# Patient Record
Sex: Male | Born: 1950 | Race: Black or African American | Hispanic: No | Marital: Single | Smoking: Current some day smoker
Health system: Southern US, Community
[De-identification: ages and names within clinical notes are randomized; demographics above are authoritative.]

## PROBLEM LIST (undated history)

## (undated) DIAGNOSIS — I10 Essential (primary) hypertension: Secondary | ICD-10-CM

## (undated) DIAGNOSIS — Z992 Dependence on renal dialysis: Secondary | ICD-10-CM

## (undated) DIAGNOSIS — K219 Gastro-esophageal reflux disease without esophagitis: Secondary | ICD-10-CM

## (undated) DIAGNOSIS — E875 Hyperkalemia: Secondary | ICD-10-CM

## (undated) DIAGNOSIS — N289 Disorder of kidney and ureter, unspecified: Secondary | ICD-10-CM

## (undated) HISTORY — PX: AV FISTULA PLACEMENT: SHX1204

---

## 2007-07-19 ENCOUNTER — Encounter: Payer: Self-pay | Admitting: Cardiology

## 2007-08-11 ENCOUNTER — Ambulatory Visit: Payer: Self-pay | Admitting: Vascular Surgery

## 2007-08-18 ENCOUNTER — Ambulatory Visit: Payer: Self-pay | Admitting: Vascular Surgery

## 2007-09-06 ENCOUNTER — Ambulatory Visit: Payer: Self-pay | Admitting: Vascular Surgery

## 2007-09-06 ENCOUNTER — Ambulatory Visit (HOSPITAL_COMMUNITY): Admission: RE | Admit: 2007-09-06 | Discharge: 2007-09-06 | Payer: Self-pay | Admitting: Vascular Surgery

## 2007-10-06 ENCOUNTER — Ambulatory Visit: Payer: Self-pay | Admitting: Vascular Surgery

## 2007-12-03 ENCOUNTER — Ambulatory Visit (HOSPITAL_COMMUNITY): Admission: RE | Admit: 2007-12-03 | Discharge: 2007-12-03 | Payer: Self-pay | Admitting: Nephrology

## 2007-12-08 ENCOUNTER — Ambulatory Visit: Payer: Self-pay | Admitting: Vascular Surgery

## 2007-12-24 ENCOUNTER — Ambulatory Visit (HOSPITAL_COMMUNITY): Admission: RE | Admit: 2007-12-24 | Discharge: 2007-12-24 | Payer: Self-pay | Admitting: Nephrology

## 2007-12-27 ENCOUNTER — Ambulatory Visit (HOSPITAL_COMMUNITY): Admission: RE | Admit: 2007-12-27 | Discharge: 2007-12-27 | Payer: Self-pay | Admitting: Nephrology

## 2008-12-19 ENCOUNTER — Encounter: Payer: Self-pay | Admitting: Cardiology

## 2009-02-12 ENCOUNTER — Emergency Department (HOSPITAL_COMMUNITY): Admission: EM | Admit: 2009-02-12 | Discharge: 2009-02-12 | Payer: Self-pay | Admitting: Emergency Medicine

## 2009-02-13 ENCOUNTER — Emergency Department (HOSPITAL_COMMUNITY): Admission: EM | Admit: 2009-02-13 | Discharge: 2009-02-13 | Payer: Self-pay | Admitting: Emergency Medicine

## 2009-03-23 DIAGNOSIS — I10 Essential (primary) hypertension: Secondary | ICD-10-CM | POA: Insufficient documentation

## 2009-03-23 DIAGNOSIS — N186 End stage renal disease: Secondary | ICD-10-CM

## 2009-04-06 ENCOUNTER — Emergency Department (HOSPITAL_COMMUNITY): Admission: EM | Admit: 2009-04-06 | Discharge: 2009-04-06 | Payer: Self-pay | Admitting: Family Medicine

## 2009-04-10 ENCOUNTER — Ambulatory Visit: Payer: Self-pay | Admitting: Cardiology

## 2009-04-10 DIAGNOSIS — R0602 Shortness of breath: Secondary | ICD-10-CM

## 2009-04-20 ENCOUNTER — Encounter (INDEPENDENT_AMBULATORY_CARE_PROVIDER_SITE_OTHER): Payer: Self-pay | Admitting: *Deleted

## 2010-03-12 NOTE — Letter (Signed)
Summary: [facility] Medical Care Problem List  [facility] Medical Care Problem List   Imported By: Roderic Ovens 04/24/2009 10:14:00  _____________________________________________________________________  External Attachment:    Type:   Image     Comment:   External Document

## 2010-03-12 NOTE — Assessment & Plan Note (Signed)
Summary: NP6/SOB/JSS   Visit Type:  Initial Consult Referring Provider:  Dr. Arrie Aran  CC:  Sob-HTN.  History of Present Illness: 60 yo with history of ESRD and HTN presents for evaluation of exertional dyspnea.  Patient reports labored breathing/shortness of breath when walking up a hill or climbing a flight of steps.  He says this does not happen every time and thinks that it happens when his hemoglobin is low.  He has never had chest pain or tightness.  He works as a Visual merchandiser (heating and A/C) and says that he does not get short of breath on the job.  No orthopnea or PND.  BP is 155/89 today but he has not taken any of his meds today.    ECG: NSR, biatrial enlargement, LVH with repolarization abnormality  labs (2/11): HCT 33  Current Medications (verified): 1)  Labetalol 400mg  Tablet .... Take 1 Tablet By Mouth Two Times A Day 2)  Fosinopril Sodium 40 Mg Tabs (Fosinopril Sodium) .... Take 1 Tablet By Mouth Two Times A Day 3)  Nexium 40 Mg Cpdr (Esomeprazole Magnesium) .... Take 1 Capsule By Mouth Once A Day 4)  Aspirin 81 Mg Tbec (Aspirin) .... Take One Tablet By Mouth Daily 5)  Rena-Vite  Tabs (B Complex-C-Folic Acid) .... Take 1 Tablet By Mouth Once A Day 6)  Norvasc 10 Mg Tabs (Amlodipine Besylate) .... Take 1 Tablet By Mouth Two Times A Day 7)  Doxazosin Mesylate 8 Mg Tabs (Doxazosin Mesylate) .... Take 1 Tablet By Mouth Once A Day 8)  Protonix 40 Mg Tbec (Pantoprazole Sodium) .... Take 1 Tablet By Mouth Once A Day  Allergies (verified): No Known Drug Allergies  Past History:  Past Medical History: 1. End-stage renal disease on hemodialysis Tuesday, Thursday and Saturday.  Thought to be secondary to HTN.  2. Hypertension.  3. Fe deficiency anemia 4. Secondary hyperparathyroidism 5. Prior cocaine abuse (198s) 6. Prior smoker (quit 2005) 7. H/o dialysis catheter infection.  Family History: Father with MI at 83  Social History: The patient is single.  He  lives between a family member's house and his girlfriend's house.  He quit smoking in 2005 except for an "occasional" cigarette.  He denies any EtOH or illegal drug use.  He used cocaine in the 1980s.  He works as a Visual merchandiser (heating and A/C).   Review of Systems       All systems reviewed and negative except as per HPI.   Vital Signs:  Patient profile:   60 year old male Height:      69 inches Weight:      178 pounds BMI:     26.38 Pulse rate:   72 / minute Pulse rhythm:   regular Resp:     18 per minute BP sitting:   155 / 89  (right arm) Cuff size:   large  Vitals Entered By: Vikki Ports (April 10, 2009 10:56 AM)  Physical Exam  General:  Well developed, well nourished, in no acute distress. Head:  normocephalic and atraumatic Nose:  no deformity, discharge, inflammation, or lesions Mouth:  Teeth, gums and palate normal. Oral mucosa normal. Neck:  Neck supple, JVP 8 cm. No masses, thyromegaly or abnormal cervical nodes. Lungs:  Clear bilaterally to auscultation and percussion. Heart:  Non-displaced PMI, chest non-tender; regular rate and rhythm, S1, S2 without murmurs, rubs. +S4. Carotid upstroke normal, no bruit.  Pedals normal pulses. Trace ankle edema.  Abdomen:  Bowel sounds positive; abdomen soft  and non-tender without masses, organomegaly, or hernias noted. No hepatosplenomegaly. Msk:  Back normal, normal gait. Muscle strength and tone normal. Extremities:  No clubbing or cyanosis. Neurologic:  Alert and oriented x 3. Skin:  Intact without lesions or rashes. Psych:  Normal affect.   Impression & Recommendations:  Problem # 1:  SHORTNESS OF BREATH (ICD-786.05) Patient has shortness of breath with moderate exertion such as stairs or up a hill.  He attributes it to anemia.  However, his long-standing HTN and ESRD do put him at higher risk for CAD.  I think that it is reasonable to risk stratify with ETT-myoview given the possibility of ischemic diastolic  dysfunction as a cause of his symptoms.  I will also check an echocardiogram to assess LV function.  He should continue ASA 81 mg daily.    Problem # 2:  HYPERTENSION (ICD-401.9) BP is high today but he has not taken any meds.  Will continue current meds with adjustment by Dr. Arrie Aran as needed.   I will have him get fasting lipids/LFTs  Other Orders: Nuclear Stress Test (Nuc Stress Test) Echocardiogram (Echo)  Patient Instructions: 1)  Your physician has requested that you have an echocardiogram.  Echocardiography is a painless test that uses sound waves to create images of your heart. It provides your doctor with information about the size and shape of your heart and how well your heart's chambers and valves are working.  This procedure takes approximately one hour. There are no restrictions for this procedure. 2)  Your physician has requested that you have an exercise stress myoview.  For further information please visit [email or web address]  Please follow instruction sheet, as given. 3)  Your physician recommends that you have a FASTING lipid profile/liver profile --- 786.09 401.9 4)  Your physician recommends that you schedule a follow-up appointment in: after testing is complete in 2 weeks with Dr Shirlee Latch

## 2010-03-12 NOTE — Letter (Signed)
Summary: Generic Letter  [facility]     [place], [place]    Phone:   Fax:         April 20, 2009 MRN: [id number]    ARVAL BRANDSTETTER [address] [place], [place]  [address]    Dear Mr. Navratil,   During your last office visit.  Dr. Shirlee Latch ordered Labs, Echocardiogram and Stress Myoview.  We have been unable to reach you.  Please give our office a call at your earliest convenience to schedule the above appointments.          Sincerely,  Jasmine December Ferguson/Dr. Marca Ancona   This letter has been electronically signed by your physician.

## 2010-04-28 LAB — COMPREHENSIVE METABOLIC PANEL WITH GFR
ALT: 19 U/L (ref 0–53)
AST: 22 U/L (ref 0–37)
Alkaline Phosphatase: 65 U/L (ref 39–117)
BUN: 40 mg/dL — ABNORMAL HIGH (ref 6–23)
Chloride: 100 meq/L (ref 96–112)
Potassium: 4.2 meq/L (ref 3.5–5.1)
Total Protein: 7.6 g/dL (ref 6.0–8.3)

## 2010-04-28 LAB — CBC
HCT: 35.4 % — ABNORMAL LOW (ref 39.0–52.0)
HCT: 35.7 % — ABNORMAL LOW (ref 39.0–52.0)
Hemoglobin: 12.1 g/dL — ABNORMAL LOW (ref 13.0–17.0)
Hemoglobin: 12.3 g/dL — ABNORMAL LOW (ref 13.0–17.0)
MCHC: 34.3 g/dL (ref 30.0–36.0)
MCHC: 34.5 g/dL (ref 30.0–36.0)
MCV: 80.6 fL (ref 78.0–100.0)
MCV: 81.9 fL (ref 78.0–100.0)
Platelets: 200 10*3/uL (ref 150–400)
Platelets: 213 10*3/uL (ref 150–400)
RBC: 4.43 MIL/uL (ref 4.22–5.81)
RDW: 16.7 % — ABNORMAL HIGH (ref 11.5–15.5)
RDW: 17 % — ABNORMAL HIGH (ref 11.5–15.5)
WBC: 9.4 K/uL (ref 4.0–10.5)

## 2010-04-28 LAB — URINE CULTURE: Culture: NO GROWTH

## 2010-04-28 LAB — COMPREHENSIVE METABOLIC PANEL
Albumin: 3.4 g/dL — ABNORMAL LOW (ref 3.5–5.2)
Albumin: 3.6 g/dL (ref 3.5–5.2)
BUN: 55 mg/dL — ABNORMAL HIGH (ref 6–23)
CO2: 26 mEq/L (ref 19–32)
Calcium: 8.8 mg/dL (ref 8.4–10.5)
Calcium: 9.2 mg/dL (ref 8.4–10.5)
Creatinine, Ser: 12.28 mg/dL — ABNORMAL HIGH (ref 0.4–1.5)
Creatinine, Ser: 9.13 mg/dL — ABNORMAL HIGH (ref 0.4–1.5)
GFR calc Af Amer: 7 mL/min — ABNORMAL LOW (ref >60)
GFR calc non Af Amer: 6 mL/min — ABNORMAL LOW (ref >60)
Glucose, Bld: 111 mg/dL — ABNORMAL HIGH (ref 70–99)
Glucose, Bld: 115 mg/dL — ABNORMAL HIGH (ref 70–99)
Sodium: 139 mEq/L (ref 135–145)
Total Bilirubin: 0.8 mg/dL (ref 0.3–1.2)
Total Protein: 7.5 g/dL (ref 6.0–8.3)

## 2010-04-28 LAB — URINALYSIS, ROUTINE W REFLEX MICROSCOPIC
Bilirubin Urine: NEGATIVE
Glucose, UA: 100 mg/dL — AB
Ketones, ur: NEGATIVE mg/dL
Leukocytes, UA: NEGATIVE
Nitrite: NEGATIVE
Protein, ur: >300 mg/dL — AB
Specific Gravity, Urine: 1.018 (ref 1.005–1.030)
Urobilinogen, UA: 0.2 mg/dL (ref 0.0–1.0)
pH: 8.5 — ABNORMAL HIGH (ref 5.0–8.0)

## 2010-04-28 LAB — CULTURE, BLOOD (ROUTINE X 2)
Culture: NO GROWTH
Culture: NO GROWTH

## 2010-04-28 LAB — CLOSTRIDIUM DIFFICILE EIA: C difficile Toxins A+B, EIA: NEGATIVE

## 2010-04-28 LAB — DIFFERENTIAL
Basophils Absolute: 0 10*3/uL (ref 0.0–0.1)
Basophils Relative: 0 % (ref 0–1)
Basophils Relative: 0 % (ref 0–1)
Eosinophils Absolute: 0.1 10*3/uL (ref 0.0–0.7)
Eosinophils Relative: 1 % (ref 0–5)
Lymphocytes Relative: 14 % (ref 12–46)
Lymphocytes Relative: 6 % — ABNORMAL LOW (ref 12–46)
Lymphs Abs: 0.5 K/uL — ABNORMAL LOW (ref 0.7–4.0)
Monocytes Absolute: 0.7 10*3/uL (ref 0.1–1.0)
Monocytes Absolute: 0.9 10*3/uL (ref 0.1–1.0)
Monocytes Relative: 14 % — ABNORMAL HIGH (ref 3–12)
Monocytes Relative: 8 % (ref 3–12)
Neutro Abs: 4.1 10*3/uL (ref 1.7–7.7)
Neutro Abs: 8 10*3/uL — ABNORMAL HIGH (ref 1.7–7.7)
Neutrophils Relative %: 69 % (ref 43–77)
Neutrophils Relative %: 86 % — ABNORMAL HIGH (ref 43–77)

## 2010-04-28 LAB — LACTIC ACID, PLASMA: Lactic Acid, Venous: 0.9 mmol/L (ref 0.5–2.2)

## 2010-04-28 LAB — URINE MICROSCOPIC-ADD ON

## 2010-04-28 LAB — STOOL CULTURE

## 2010-04-28 LAB — LIPASE, BLOOD: Lipase: 40 U/L (ref 11–59)

## 2010-04-28 LAB — CK TOTAL AND CKMB (NOT AT ARMC): Relative Index: 1.2 (ref 0.0–2.5)

## 2010-05-01 LAB — CBC
Hemoglobin: 12.4 g/dL — ABNORMAL LOW (ref 13.0–17.0)
MCHC: 33.6 g/dL (ref 30.0–36.0)
MCV: 84.2 fL (ref 78.0–100.0)
RBC: 4.38 MIL/uL (ref 4.22–5.81)
WBC: 5.8 10*3/uL (ref 4.0–10.5)

## 2010-05-01 LAB — DIFFERENTIAL
Basophils Relative: 1 % (ref 0–1)
Eosinophils Absolute: 0 10*3/uL (ref 0.0–0.7)
Lymphs Abs: 0.6 10*3/uL — ABNORMAL LOW (ref 0.7–4.0)
Monocytes Absolute: 1 10*3/uL (ref 0.1–1.0)
Monocytes Relative: 18 % — ABNORMAL HIGH (ref 3–12)
Neutro Abs: 4.2 10*3/uL (ref 1.7–7.7)
Neutrophils Relative %: 72 % (ref 43–77)

## 2010-05-01 LAB — POCT I-STAT, CHEM 8
Calcium, Ion: 1.04 mmol/L — ABNORMAL LOW (ref 1.12–1.32)
Chloride: 102 mEq/L (ref 96–112)
Creatinine, Ser: 11 mg/dL — ABNORMAL HIGH (ref 0.4–1.5)
Glucose, Bld: 101 mg/dL — ABNORMAL HIGH (ref 70–99)
Potassium: 4.1 mEq/L (ref 3.5–5.1)

## 2010-06-25 NOTE — Assessment & Plan Note (Signed)
OFFICE VISIT   Corey Lamb, Corey Lamb  DOB:  1950/09/05                                       08/18/2007  [id number]#:[id number]   The patient is a 60 year old male referred for consideration for long-  term hemodialysis access.  He has been on dialysis since April of 2009.  He is currently using a left-sided Diatek catheter.  He has had no  problems with the catheter.   He currently dialyzes on Tuesday, Thursday and Saturday.  He is right-  handed.   PAST MEDICAL HISTORY:  Significant for end-stage renal disease and  hypertension.  He has had no prior operations.   MEDICATIONS:  Include doxazosin, Benicar, Norvasc and lisinopril.   He has no known drug allergies.   SOCIAL HISTORY:  He works as an Geographical information systems officer.   REVIEW OF SYSTEMS:  He denies episodes of chest pain, shortness of  breath, TIA, stroke, diabetes or arthritis.   PHYSICAL EXAM:  Blood pressure is 123/81 in the left arm, pulse is 73  and regular.  HEENT is unremarkable.  He has 2+ carotid pulses without  bruit.  Chest:  Clear to auscultation.  Cardiac:  Exam is regular rate  and rhythm without murmur.  Abdomen is soft, nontender, nondistended.  No masses.  He has 2+ brachial and radial pulses bilaterally.  On  placement of a tourniquet in the left upper arm, there is an easily  palpable cephalic vein extending from the elbow up to the upper arm.   He had a vein mapping ultrasound performed on July 1.  This showed that  the cephalic vein from the elbow up is approximately 0.4 cm in diameter.  The vein becomes quite small in the distal forearm.   I believe the best option for this patient for long-term hemodialysis  access is going to be a left brachiocephalic AV fistula.  I described to  him today the procedure details, risks, benefits, and possible  complications including but not limited to bleeding, infection fistula  not maturation.  He will have the fistula placed on September 06, 2007 as  he  is currently working on installing an air conditioning system.   Janetta Hora. Fields, MD  Electronically Signed   CEF/MEDQ  D:  08/18/2007  T:  08/19/2007  Job:  [id number]   cc:   [facility]

## 2010-06-25 NOTE — Assessment & Plan Note (Signed)
OFFICE VISIT   ALAM, GUTERREZ  DOB:  [birth year]                                       10/06/2007  [id number]#:[id number]   The patient returns for followup today after placement of a left  brachiocephalic AV fistula approximately 1 month ago.  Currently  dialyzing via catheter.   EXAM:  Today he has no evidence of steal.  Incision is well-healed.  He  has palpable thrill and easily visible fistula all the way up to the mid  upper arm.   Fistula should be ready for use in the next 3 to 4 weeks.  He will  follow up with me on an as-needed basis.   Janetta Hora. Fields, MD  Electronically Signed   CEF/MEDQ  D:  10/06/2007  T:  10/07/2007  Job:  [id number]   cc:   [facility]

## 2010-06-25 NOTE — Procedures (Signed)
CEPHALIC VEIN MAPPING   INDICATION:  Vein mapping for dialysis access.  Right arm dominant.   HISTORY:  ESRD.   EXAM:  The right cephalic vein is not evaluated.   Diameter measurements range from   The left cephalic vein is compressible.   Diameter measurements range from 0.30 cm - 0.41 cm.   See attached worksheet for all measurements.   IMPRESSION:  Patent left cephalic vein which is of acceptable diameter  for use as a dialysis access from the level of the wrist to the proximal  arm.  It appears there is a paralateral branch that runs from the level  of the wrist to the proximal forearm.   ___________________________________________  Janetta Hora Fields, MD   PB/MEDQ  D:  08/12/2007  T:  08/12/2007  Job:  [id number]

## 2010-06-25 NOTE — Op Note (Signed)
NAME:  Corey Lamb, Corey Lamb                ACCOUNT NO.:  [id number]   MEDICAL RECORD NO.:  [id number]          PATIENT TYPE:  AMB   LOCATION:  SDS                          FACILITY:  [facility]   PHYSICIAN:  Janetta Hora. Fields, MD  DATE OF BIRTH:  07-03-1950   DATE OF PROCEDURE:  09/06/2007  DATE OF DISCHARGE:  09/06/2007                               OPERATIVE REPORT   PROCEDURE:  Left brachiocephalic arteriovenous fistula.   PREOPERATIVE DIAGNOSIS:  End-stage renal disease.   POSTOPERATIVE DIAGNOSIS:  End-stage renal disease.   ANESTHESIA:  Local with IV sedation.   ASSISTANT:  Wilmon Arms, PA-C   OPERATIVE FINDING:  A 3-4 mm left cephalic vein.   OPERATIVE DETAILS:  After obtaining informed consent, the patient was  taken to the operating room.  The patient was placed in supine position  on the operating room table.  After adequate sedation, the patient's  entire left upper extremity was prepped and draped in the usual sterile  fashion.  Local anesthesia was infiltrated into the antecubital crease.  A transverse incision was made in this location and carried down through  the subcutaneous tissues down to the level of cephalic vein.  The  cephalic vein was dissected free circumferentially.  It was slightly  sclerotic in external appearance.  It was approximately 3.5 to 4 mm in  diameter.  The small side branches were ligated and divided between silk  ties.  Next, the brachial artery was dissected free in the medial  portion of the incision.  The patient was then given 5000 units of  intravenous heparin.  The distal cephalic vein was ligated with a 2-0  silk tie and transected.  It was then swung over to the level of the  brachial artery.  It was gently distended with heparinized saline.  There were some small webs at the proximal aspect of the vein and these  were debrided away.  The vein accepted up to a 5-mm dilator.  Vessel  loops were used to control the brachial artery proximally  and distally.  A longitudinal opening was made in the brachial artery and the vein was  sewn end-to-vein to the side of  artery using a running 6-0 Prolene  suture.  Just prior to completion of anastomosis, this forebled, back-  bled and thoroughly flushed.  Anastomosis was secured, clamps were  released, there was a palpable thrill in the fistula immediately.  Next,  hemostasis was obtained.  The subcutaneous tissues were reapproximated  using running 3-0 Vicryl suture.  The skin was closed with 4-0 Vicryl  subcuticular stitch.  The patient tolerated the procedure well and there  were no complications through.  Instruments, sponge, and needle counts  were correct at the end of the case.  The patient was taken to the  recovery room in stable condition.      Janetta Hora. Fields, MD  Electronically Signed     CEF/MEDQ  D:  09/07/2007  T:  09/07/2007  Job:  [id number]

## 2010-06-25 NOTE — Assessment & Plan Note (Signed)
OFFICE VISIT   AXCEL, HORSCH  DOB:  [birth year]                                       12/08/2007  EAVWU#:[id number]   The patient is status post placement of a left brachiocephalic AV  fistula on August 07, 2007.  He presents to the office today for  evaluation of his fistula, complaining of stinging in the upper portion  of the fistula when the fistula is cannulated.  He denies any numbness  or tingling in his hand.  He states that he has no pain in the fistula  when it is not been cannulated.  He had a fistulogram performed on  December 03, 1998, this showed no significant stenosis throughout the  course of the fistula.   On physical exam today, blood pressure 167/94, pulse is 77 and regular,  he has an easily palpable thrill in the left upper arm AV fistula.  The  fistula is fairly uniform in diameter, approximately 6-7 mm.   I discussed at length with the patient the use of his fistula.  He  states that they are not having difficulty cannulating the fistula and  that he is not having problems with clearance or increased pressures on  dialysis.  Unfortunately, I do not have any explanation for why he has a  burning and stinging sensation in the fistula when he is on dialysis.  I  did discuss with him today that they should be able to use the entire  course of the fistula wherever it is palpable.  Hopefully, his symptoms  will resolve over time.   Janetta Hora. Fields, MD  Electronically Signed   CEF/MEDQ  D:  12/09/2007  T:  12/09/2007  Job:  [id number]   cc:   Terrial Rhodes, M.D.

## 2010-11-08 LAB — POCT I-STAT 4, (NA,K, GLUC, HGB,HCT)
Glucose, Bld: 84
Hemoglobin: 16.3
Potassium: 4.2

## 2010-12-16 ENCOUNTER — Emergency Department (INDEPENDENT_AMBULATORY_CARE_PROVIDER_SITE_OTHER)
Admission: EM | Admit: 2010-12-16 | Discharge: 2010-12-16 | Disposition: A | Discharge status: Home / Self Care | Payer: Medicare Other | Source: Home / Self Care | Attending: Family Medicine | Admitting: Family Medicine

## 2010-12-16 DIAGNOSIS — J069 Acute upper respiratory infection, unspecified: Secondary | ICD-10-CM

## 2010-12-16 DIAGNOSIS — J209 Acute bronchitis, unspecified: Secondary | ICD-10-CM

## 2010-12-16 HISTORY — DX: Gastro-esophageal reflux disease without esophagitis: K21.9

## 2010-12-16 HISTORY — DX: Essential (primary) hypertension: I10

## 2010-12-16 HISTORY — DX: Disorder of kidney and ureter, unspecified: N28.9

## 2010-12-16 MED ORDER — FLUTICASONE PROPIONATE 50 MCG/ACT NA SUSP: 2.0000 | Freq: Every day | NASAL | Status: DC

## 2010-12-16 MED ORDER — AZITHROMYCIN 250 MG PO TABS: 250.0000 mg | ORAL_TABLET | Freq: Every day | ORAL | Status: AC

## 2010-12-16 MED ORDER — GUAIFENESIN-CODEINE 100-10 MG/5ML PO SYRP: 5.0000 mL | ORAL_SOLUTION | Freq: Three times a day (TID) | ORAL | Status: AC | PRN

## 2010-12-16 NOTE — ED Provider Notes (Signed)
History     CSN: [id number] Arrival date & time: 12/16/2010  2:07 PM   First MD Initiated Contact with Patient 12/16/10 1410      No chief complaint on file.   (Consider location/radiation/quality/duration/timing/severity/associated sxs/prior treatment) Patient is a 60 y.o. male presenting with URI.  URI The primary symptoms include sore throat and cough. Primary symptoms do not include fever, ear pain, nausea or vomiting. The current episode started 3 to 5 days ago. This is a new problem. The problem has been gradually worsening.  The sore throat began more than 2 days ago. The sore throat has been unchanged since its onset. The sore throat is moderate in intensity. The sore throat is accompanied by trouble swallowing and hoarse voice. The sore throat is not accompanied by drooling or stridor.  The cough began 3 to 5 days ago. The cough is new. The cough is non-productive and dry.  Symptoms associated with the illness include congestion and rhinorrhea. The illness is not associated with chills. Risk factors for severe complications from URI include chronic kidney disease.    No past medical history on file.  No past surgical history on file.  No family history on file.  History  Substance Use Topics  . Smoking status: Not on file  . Smokeless tobacco: Not on file  . Alcohol Use: Not on file      Review of Systems  Constitutional: Negative for fever and chills.  HENT: Positive for congestion, sore throat, hoarse voice, rhinorrhea and trouble swallowing. Negative for ear pain, sneezing and drooling.   Eyes: Negative.   Respiratory: Positive for cough. Negative for stridor.   Cardiovascular: Negative.   Gastrointestinal: Negative.  Negative for nausea and vomiting.  Genitourinary: Negative.     Allergies  Review of patient's allergies indicates not on file.  Home Medications  No current outpatient prescriptions on file.  BP 155/80  Pulse 72  Temp(Src) 97.7 F (36.5  C) (Oral)  Resp 20  SpO2 100%  Physical Exam  Constitutional: He appears well-developed and well-nourished.  HENT:  Head: Normocephalic and atraumatic.  Right Ear: Tympanic membrane and external ear normal.  Left Ear: Tympanic membrane and external ear normal.  Nose: Mucosal edema present.  Mouth/Throat: Uvula is midline and mucous membranes are normal. Posterior oropharyngeal erythema present. No oropharyngeal exudate or tonsillar abscesses.  Eyes: EOM are normal. Pupils are equal, round, and reactive to light.  Cardiovascular: Normal rate and regular rhythm.   Pulmonary/Chest: Effort normal and breath sounds normal.  Skin: Skin is warm and dry.    ED Course  Procedures (including critical care time)   Labs Reviewed  POCT RAPID STREP A   No results found.   No diagnosis found.    MDM  Rapid strep negative        Richardo Priest, MD 12/16/10 1458

## 2010-12-16 NOTE — ED Notes (Signed)
Pt returned to [facility], requesting to be seen

## 2010-12-16 NOTE — ED Notes (Signed)
Pt told registration earlier that he was leaving and going elsewhere.  Pt returns now, stating he wants to be seen now.

## 2010-12-18 LAB — POCT RAPID STREP A: Streptococcus, Group A Screen (Direct): NEGATIVE

## 2012-01-04 ENCOUNTER — Encounter (HOSPITAL_COMMUNITY): Payer: Self-pay | Admitting: *Deleted

## 2012-01-04 ENCOUNTER — Emergency Department (HOSPITAL_COMMUNITY): Payer: Medicare Other

## 2012-01-04 ENCOUNTER — Emergency Department (HOSPITAL_COMMUNITY)
Admission: EM | Admit: 2012-01-04 | Discharge: 2012-01-04 | Disposition: A | Discharge status: Home / Self Care | Payer: Medicare Other | Attending: Emergency Medicine | Admitting: Emergency Medicine

## 2012-01-04 DIAGNOSIS — K5289 Other specified noninfective gastroenteritis and colitis: Secondary | ICD-10-CM | POA: Insufficient documentation

## 2012-01-04 DIAGNOSIS — R197 Diarrhea, unspecified: Secondary | ICD-10-CM | POA: Insufficient documentation

## 2012-01-04 DIAGNOSIS — Z79899 Other long term (current) drug therapy: Secondary | ICD-10-CM | POA: Insufficient documentation

## 2012-01-04 DIAGNOSIS — K219 Gastro-esophageal reflux disease without esophagitis: Secondary | ICD-10-CM | POA: Insufficient documentation

## 2012-01-04 DIAGNOSIS — R0602 Shortness of breath: Secondary | ICD-10-CM | POA: Insufficient documentation

## 2012-01-04 DIAGNOSIS — F17200 Nicotine dependence, unspecified, uncomplicated: Secondary | ICD-10-CM | POA: Insufficient documentation

## 2012-01-04 DIAGNOSIS — K529 Noninfective gastroenteritis and colitis, unspecified: Secondary | ICD-10-CM

## 2012-01-04 DIAGNOSIS — Z87798 Personal history of other (corrected) congenital malformations: Secondary | ICD-10-CM | POA: Insufficient documentation

## 2012-01-04 DIAGNOSIS — I10 Essential (primary) hypertension: Secondary | ICD-10-CM | POA: Insufficient documentation

## 2012-01-04 DIAGNOSIS — R112 Nausea with vomiting, unspecified: Secondary | ICD-10-CM | POA: Insufficient documentation

## 2012-01-04 HISTORY — DX: Dependence on renal dialysis: Z99.2

## 2012-01-04 LAB — CBC WITH DIFFERENTIAL/PLATELET
Basophils Absolute: 0 10*3/uL (ref 0.0–0.1)
Basophils Relative: 0 % (ref 0–1)
Lymphocytes Relative: 23 % (ref 12–46)
MCHC: 34.1 g/dL (ref 30.0–36.0)
Monocytes Absolute: 0.7 10*3/uL (ref 0.1–1.0)
Neutro Abs: 4.1 10*3/uL (ref 1.7–7.7)
Neutrophils Relative %: 63 % (ref 43–77)
Platelets: 177 10*3/uL (ref 150–400)
RDW: 14.2 % (ref 11.5–15.5)
WBC: 6.5 10*3/uL (ref 4.0–10.5)

## 2012-01-04 LAB — COMPREHENSIVE METABOLIC PANEL
ALT: 17 U/L (ref 0–53)
AST: 11 U/L (ref 0–37)
Albumin: 3.2 g/dL — ABNORMAL LOW (ref 3.5–5.2)
Chloride: 93 mEq/L — ABNORMAL LOW (ref 96–112)
Creatinine, Ser: 14.69 mg/dL — ABNORMAL HIGH (ref 0.50–1.35)
Potassium: 4.2 mEq/L (ref 3.5–5.1)
Sodium: 137 mEq/L (ref 135–145)
Total Bilirubin: 0.3 mg/dL (ref 0.3–1.2)

## 2012-01-04 LAB — POCT I-STAT TROPONIN I: Troponin i, poc: 0.02 ng/mL (ref 0.00–0.08)

## 2012-01-04 LAB — TROPONIN I: Troponin I: <0.3 ng/mL (ref <0.30)

## 2012-01-04 MED ORDER — IOHEXOL 300 MG/ML  SOLN
100.0000 mL | Freq: Once | INTRAMUSCULAR | Status: AC | PRN
  Administered 2012-01-04: 100 mL via INTRAVENOUS

## 2012-01-04 MED ORDER — ONDANSETRON HCL 4 MG PO TABS: 4.0000 mg | ORAL_TABLET | Freq: Four times a day (QID) | ORAL | Status: DC

## 2012-01-04 MED ORDER — IOHEXOL 300 MG/ML  SOLN
20.0000 mL | INTRAMUSCULAR | Status: AC
Start: 2012-01-04 — End: 2012-01-04
  Administered 2012-01-04: 20 mL via ORAL

## 2012-01-04 MED ORDER — SODIUM CHLORIDE 0.9 % IV SOLN: Freq: Once | INTRAVENOUS | Status: DC

## 2012-01-04 NOTE — ED Notes (Addendum)
Pt reports having generalized abd pain and distention since wed. Having diarrhea and one episode of vomiting. Last dialysis tx Thursday.

## 2012-01-04 NOTE — ED Notes (Signed)
Pt presents to department for evaluation of diffuse abdominal pain. Also states nausea and vomiting. Ongoing x4 days. 8/10 pain at the time. He is hemodialysis patient, treatments Tuesday, Thursday, and Saturday. States he missed his treatment Saturday. Pt is conscious alert and oriented x4.

## 2012-01-04 NOTE — ED Provider Notes (Signed)
History     CSN: [id number]  Arrival date & time 01/04/12  1042   First MD Initiated Contact with Patient 01/04/12 1138      Chief Complaint  Patient presents with  . Abdominal Pain    (Consider location/radiation/quality/duration/timing/severity/associated sxs/prior treatment) Patient is a 61 y.o. male presenting with abdominal pain. The history is provided by the patient.  Abdominal Pain The primary symptoms of the illness include abdominal pain, shortness of breath, nausea, vomiting and diarrhea. The primary symptoms of the illness do not include fever.  Additional symptoms associated with the illness include diaphoresis. Associated symptoms comments: He reports he started having generalized abdominal pain 5 days ago, worsening until yesterday when he had severe pain, diaphoresis, one episode of vomiting and generalized weakness that kept him from going to dialysis yesterday. No fever. He reports diarrhea until yesterday when he took "a diarrhea pill" he had at home and reports one formed stool today. He continues to pass gas. No previous abdominal surgeries. No further nausea. He denies chest pain and reports mild SOB..    Past Medical History  Diagnosis Date  . Renal disorder   . Hypertension   . GERD (gastroesophageal reflux disease)     History reviewed. No pertinent past surgical history.  History reviewed. No pertinent family history.  History  Substance Use Topics  . Smoking status: Current Some Day Smoker  . Smokeless tobacco: Not on file  . Alcohol Use: No      Review of Systems  Constitutional: Positive for diaphoresis. Negative for fever.  Respiratory: Positive for shortness of breath.   Cardiovascular: Negative for chest pain.  Gastrointestinal: Positive for nausea, vomiting, abdominal pain and diarrhea. Negative for blood in stool.  Genitourinary: Negative for flank pain.  Musculoskeletal: Negative for myalgias.  Neurological: Positive for weakness.    Psychiatric/Behavioral: Negative for confusion.    Allergies  Review of patient's allergies indicates no known allergies.  Home Medications   Current Outpatient Rx  Name  Route  Sig  Dispense  Refill  . AMLODIPINE BESYLATE 10 MG PO TABS   Oral   Take 10 mg by mouth daily.           Marland Kitchen CALCIUM ACETATE (PHOS BINDER) 667 MG/5ML PO SOLN   Oral   Take by mouth 3 (three) times daily with meals.           Marland Kitchen DOXAZOSIN MESYLATE 8 MG PO TABS   Oral   Take 8 mg by mouth at bedtime.         Marland Kitchen LABETALOL HCL 100 MG PO TABS   Oral   Take 200 mg by mouth 2 (two) times daily.          Marland Kitchen RENA-VITE PO TABS   Oral   Take 1 tablet by mouth daily.           Marland Kitchen OMEPRAZOLE 40 MG PO CPDR   Oral   Take 40 mg by mouth daily.           BP 105/72  Pulse 73  Temp 98 F (36.7 C) (Oral)  Resp 18  SpO2 99%  Physical Exam  Constitutional: He is oriented to person, place, and time. He appears well-developed and well-nourished.  HENT:  Head: Normocephalic.  Neck: Normal range of motion. Neck supple.  Cardiovascular: Normal rate and regular rhythm.   Pulmonary/Chest: Effort normal and breath sounds normal.  Abdominal: Soft. He exhibits distension. He exhibits no mass. There is tenderness. There  is no rebound and no guarding.       Diffuse abdominal tenderness that is greatest at the periumbilical area.   Musculoskeletal: Normal range of motion.  Neurological: He is alert and oriented to person, place, and time.  Skin: Skin is warm and dry. No rash noted.  Psychiatric: He has a normal mood and affect.    ED Course  Procedures (including critical care time)  Labs Reviewed  CBC WITH DIFFERENTIAL - Abnormal; Notable for the following:    RBC 4.03 (*)     Hemoglobin 10.8 (*)     HCT 31.7 (*)     All other components within normal limits  COMPREHENSIVE METABOLIC PANEL - Abnormal; Notable for the following:    Chloride 93 (*)     Glucose, Bld 108 (*)     BUN 69 (*)      Creatinine, Ser 14.69 (*)     Albumin 3.2 (*)     GFR calc non Af Amer 3 (*)     GFR calc Af Amer 4 (*)     All other components within normal limits  LIPASE, BLOOD - Abnormal; Notable for the following:    Lipase 96 (*)     All other components within normal limits  POCT I-STAT TROPONIN I  URINALYSIS, MICROSCOPIC ONLY  TROPONIN I   Results for orders placed during the hospital encounter of 01/04/12  CBC WITH DIFFERENTIAL      Component Value Range   WBC 6.5  4.0 - 10.5 K/uL   RBC 4.03 (*) 4.22 - 5.81 MIL/uL   Hemoglobin 10.8 (*) 13.0 - 17.0 g/dL   HCT 78.2 (*) 95.6 - 21.3 %   MCV 78.7  78.0 - 100.0 fL   MCH 26.8  26.0 - 34.0 pg   MCHC 34.1  30.0 - 36.0 g/dL   RDW 08.6  57.8 - 46.9 %   Platelets 177  150 - 400 K/uL   Neutrophils Relative 63  43 - 77 %   Neutro Abs 4.1  1.7 - 7.7 K/uL   Lymphocytes Relative 23  12 - 46 %   Lymphs Abs 1.5  0.7 - 4.0 K/uL   Monocytes Relative 10  3 - 12 %   Monocytes Absolute 0.7  0.1 - 1.0 K/uL   Eosinophils Relative 3  0 - 5 %   Eosinophils Absolute 0.2  0.0 - 0.7 K/uL   Basophils Relative 0  0 - 1 %   Basophils Absolute 0.0  0.0 - 0.1 K/uL  COMPREHENSIVE METABOLIC PANEL      Component Value Range   Sodium 137  135 - 145 mEq/L   Potassium 4.2  3.5 - 5.1 mEq/L   Chloride 93 (*) 96 - 112 mEq/L   CO2 29  19 - 32 mEq/L   Glucose, Bld 108 (*) 70 - 99 mg/dL   BUN 69 (*) 6 - 23 mg/dL   Creatinine, Ser 62.95 (*) 0.50 - 1.35 mg/dL   Calcium 9.7  8.4 - 28.4 mg/dL   Total Protein 7.6  6.0 - 8.3 g/dL   Albumin 3.2 (*) 3.5 - 5.2 g/dL   AST 11  0 - 37 U/L   ALT 17  0 - 53 U/L   Alkaline Phosphatase 67  39 - 117 U/L   Total Bilirubin 0.3  0.3 - 1.2 mg/dL   GFR calc non Af Amer 3 (*) >90 mL/min   GFR calc Af Amer 4 (*) >90 mL/min  LIPASE,  BLOOD      Component Value Range   Lipase 96 (*) 11 - 59 U/L  POCT I-STAT TROPONIN I      Component Value Range   Troponin i, poc 0.02  0.00 - 0.08 ng/mL   Comment 3           TROPONIN I      Component  Value Range   Troponin I <0.30  <0.30 ng/mL   Dg Abd Acute W/chest  01/04/2012  *RADIOLOGY REPORT*  Clinical Data: Nausea with mid abdominal pain for 3 days.  ACUTE ABDOMEN SERIES (ABDOMEN 2 VIEW & CHEST 1 VIEW)  Comparison: The abdomen films 02/13/2009.  Chest films 02/12/2009.  Findings: Frontal view of the chest demonstrates Midline trachea. Mild cardiomegaly with tortuous descending thoracic aorta.  Similar trace left pleural fluid or thickening blunting the left costophrenic angle. No pneumothorax.  No congestive failure.  There is linear opacity at the lateral left lung base which is unchanged and favored to represent an area of scarring.  Apparent increased density of the right midlung is favored to be due to summation shadow.  Abominal films demonstrate no free intraperitoneal air on upright positioning.  There are air-fluid levels within small bowel loops.  Small bowel dilatation on supine imaging at up to 3.9 cm.  Distal gas and stool.  Colon is normal in caliber.  No pneumatosis.  IMPRESSION: Mild small bowel dilatation with small bowel air fluid levels. This could represent low grade partial small bowel obstruction or an adynamic ileus.  Stable appearance of the chest.  Left base scarring with similar left pleural fluid or thickening blunting the left costophrenic angle.   Original Report Authenticated By: Jeronimo Greaves, M.D.    No results found.   No diagnosis found.    MDM  Plain film of abd shows adynamic ileus vs early SBO. CT scan ordered to further evaluate and patient is moved to CT in the care of [facility] PA-C        Rodena Medin, PA-C 01/04/12 1321

## 2012-01-04 NOTE — ED Provider Notes (Signed)
Medical screening examination/treatment/procedure(s) were performed by non-physician practitioner and as supervising physician I was immediately available for consultation/collaboration.    Nelia Shi, MD 01/04/12 [phone]

## 2012-01-04 NOTE — ED Provider Notes (Signed)
Awaits abd CT.  ?SBO.  If can tolerate PO and CT neg then d/c.  Consider consult nephrology to readjust dialysis date.  (T-Th-Sat).    CT show evidence suggestive of infectious enteritis.  No evidence of SBO.  I do not think patient will benefit from antibiotic at this time.  He has no fever, normal WBC.  Pt able to make urine and able to urinate this am, do not suspect bladder outlet obstruction.  Pt currently pain free and able to tolerate PO.  Will consult nephrology to schedule close f/u for dialysis as pt missed his dialysis appointment yesterday.  (BUN 69, Cr 14.69), no resp sxs, lung CTAB today. The remainder result of the CT scan were discussed with patient with recommendation to have pt f/u with PCP.  Care discussed with my attending.    4:43 PM I have consulted with nephrologist Dr. Gardiner Barefoot, who recommend pt to call his dialysis center tomorrow for treatment.  Pt agrees with plan.  Pt stable to be discharge, he will f/u with his PCP for further care.    BP 150/81  Pulse 73  Temp 98.6 F (37 C) (Oral)  Resp 18  SpO2 100%  I have reviewed nursing notes and vital signs. I personally reviewed the imaging tests through PACS system  I reviewed available ER/hospitalization records thought the EMR  Results for orders placed during the hospital encounter of 01/04/12  CBC WITH DIFFERENTIAL      Component Value Range   WBC 6.5  4.0 - 10.5 K/uL   RBC 4.03 (*) 4.22 - 5.81 MIL/uL   Hemoglobin 10.8 (*) 13.0 - 17.0 g/dL   HCT 95.6 (*) 21.3 - 08.6 %   MCV 78.7  78.0 - 100.0 fL   MCH 26.8  26.0 - 34.0 pg   MCHC 34.1  30.0 - 36.0 g/dL   RDW 57.8  46.9 - 62.9 %   Platelets 177  150 - 400 K/uL   Neutrophils Relative 63  43 - 77 %   Neutro Abs 4.1  1.7 - 7.7 K/uL   Lymphocytes Relative 23  12 - 46 %   Lymphs Abs 1.5  0.7 - 4.0 K/uL   Monocytes Relative 10  3 - 12 %   Monocytes Absolute 0.7  0.1 - 1.0 K/uL   Eosinophils Relative 3  0 - 5 %   Eosinophils Absolute 0.2  0.0 - 0.7 K/uL   Basophils Relative 0  0 - 1 %   Basophils Absolute 0.0  0.0 - 0.1 K/uL  COMPREHENSIVE METABOLIC PANEL      Component Value Range   Sodium 137  135 - 145 mEq/L   Potassium 4.2  3.5 - 5.1 mEq/L   Chloride 93 (*) 96 - 112 mEq/L   CO2 29  19 - 32 mEq/L   Glucose, Bld 108 (*) 70 - 99 mg/dL   BUN 69 (*) 6 - 23 mg/dL   Creatinine, Ser 52.84 (*) 0.50 - 1.35 mg/dL   Calcium 9.7  8.4 - 13.2 mg/dL   Total Protein 7.6  6.0 - 8.3 g/dL   Albumin 3.2 (*) 3.5 - 5.2 g/dL   AST 11  0 - 37 U/L   ALT 17  0 - 53 U/L   Alkaline Phosphatase 67  39 - 117 U/L   Total Bilirubin 0.3  0.3 - 1.2 mg/dL   GFR calc non Af Amer 3 (*) >90 mL/min   GFR calc Af Amer 4 (*) >90 mL/min  LIPASE, BLOOD      Component Value Range   Lipase 96 (*) 11 - 59 U/L  POCT I-STAT TROPONIN I      Component Value Range   Troponin i, poc 0.02  0.00 - 0.08 ng/mL   Comment 3           TROPONIN I      Component Value Range   Troponin I <0.30  <0.30 ng/mL   Ct Abdomen Pelvis W Contrast  01/04/2012  *RADIOLOGY REPORT*  Clinical Data: Patient on hemodialysis.  Gastroesophageal reflux disease.  Hypertension.  CT ABDOMEN AND PELVIS WITH CONTRAST  Technique:  Multidetector CT imaging of the abdomen and pelvis was performed following the standard protocol during bolus administration of intravenous contrast.  Contrast: OMNIPAQUE IOHEXOL 300 MG/ML  SOLN, 1 OMNIPAQUE IOHEXOL 300 MG/ML  SOLN  Comparison: Plain film of 01/04/2012.  Most recent CT of 02/12/2009.  Findings: Lung bases:  Patchy bibasilar atelectasis.  Mild cardiomegaly, without pericardial or pleural effusion.  Abdomen/pelvis:  Normal liver, spleen.  Understood gastric cardia and body.  Normal gallbladder, biliary tract.  Cystic lesion in the uncinate process pancreas measures 1.4 cm on image 32/series 2. Similar to 2011.  Normal adrenal glands.  Moderate renal atrophy with bilateral renal masses.  These are primarily of fluid density.  Interpolar left renal lesion measures 1.5 cm  and 49 HU on portal venous phase image 31/series 2.  Similar on the delayed images. Not present 2011.  No retroperitoneal or retrocrural adenopathy.  Extensive colonic diverticulosis.  Cecum is somewhat patulous. Normal terminal ileum.  The appendix is positioned near the midline but otherwise unremarkable.  Loops of small bowel measure up to 2.8 cm, upper normal.  A loop of mildly thickened small bowel in the right side abdomen on image 43/series 2.  No significant surrounding edema.  Although the immediately proximal small bowel is relatively prominent at 2.7 cm, contrast passes this area and distal small bowel loops are normal in caliber.  No ascites.  No pneumatosis or free intraperitoneal air.  No pelvic adenopathy.  The bladder is mildly thick-walled with surrounding mild edema.  Example image 76 of series 2.  Normal prostate, without significant free pelvic fluid.  Tiny fat containing ventral abdominal wall hernia.  Celiac axis stenosis with poststenotic dilatation, mild.  Bones/Musculoskeletal:  Mild osteopenia.  Mild compression deformity at the L1 vertebral body is without canal compromise and similar.  Advanced degenerative disc disease at the lumbosacral junction.  IMPRESSION:  1.  Short segment mid small bowel wall thickening, suspicious for infectious enteritis.  Concurrent borderline small bowel dilatation which is favored to represent adynamic ileus. 2.  Apparent gastric wall thickening, favored to be due to underdistension.  Gastritis cannot be entirely excluded. 3.  Bladder wall thickening and surrounding edema, suggesting cystitis or possibly bladder outlet obstruction. 4.  Renal disease of dialysis.  Innumerable renal lesions which are primarily felt to represent simple cysts.  Indeterminate interpolar left renal lesion could represent a hemorrhagic cyst or solid neoplasm.  Consider non emergent dedicated pre and post contrast renal CT. 5. Similar size of a cystic lesion within the uncinate  process pancreas.  Favored to represent a pseudocyst versus indolent neoplasm.   Original Report Authenticated By: Jeronimo Greaves, M.D.    Dg Abd Acute W/chest  01/04/2012  *RADIOLOGY REPORT*  Clinical Data: Nausea with mid abdominal pain for 3 days.  ACUTE ABDOMEN SERIES (ABDOMEN 2 VIEW & CHEST 1 VIEW)  Comparison:  The abdomen films 02/13/2009.  Chest films 02/12/2009.  Findings: Frontal view of the chest demonstrates Midline trachea. Mild cardiomegaly with tortuous descending thoracic aorta.  Similar trace left pleural fluid or thickening blunting the left costophrenic angle. No pneumothorax.  No congestive failure.  There is linear opacity at the lateral left lung base which is unchanged and favored to represent an area of scarring.  Apparent increased density of the right midlung is favored to be due to summation shadow.  Abominal films demonstrate no free intraperitoneal air on upright positioning.  There are air-fluid levels within small bowel loops.  Small bowel dilatation on supine imaging at up to 3.9 cm.  Distal gas and stool.  Colon is normal in caliber.  No pneumatosis.  IMPRESSION: Mild small bowel dilatation with small bowel air fluid levels. This could represent low grade partial small bowel obstruction or an adynamic ileus.  Stable appearance of the chest.  Left base scarring with similar left pleural fluid or thickening blunting the left costophrenic angle.   Original Report Authenticated By: Jeronimo Greaves, M.D.       Fayrene Helper, PA-C 01/04/12 1644  Fayrene Helper, PA-C 01/04/12 1650

## 2012-01-04 NOTE — ED Provider Notes (Signed)
Medical screening examination/treatment/procedure(s) were performed by non-physician practitioner and as supervising physician I was immediately available for consultation/collaboration.   Gwyneth Sprout, MD 01/04/12 2342

## 2012-01-04 NOTE — ED Notes (Signed)
Report given to [place], Charity fundraiser. Pt moved to CDU 1.

## 2012-01-04 NOTE — ED Provider Notes (Signed)
1:12 PM Pt to move to CDU holding for CT abd/pelvis.  Sign out received from Langley Adie, PA-C.  ESRD pt on dialysis p/w with abdominal pain x several days, distension on exam.  Question of SBO on xray.  Pt has never had abdominal surgery.  Plan is for CT abd/pelvis.  If negative and pt tolerating PO, pt may be d/c home.  However, pt is Tuesday, Thursday, Saturday dialysis patient, last dialyzed on Thursday.  Potassium and CXR normal, however, if pt to be d/c, may consult renal to discuss moving up dialysis from Tuesday if needed.    2:20 PM Patient reports continued tightness in his abdomen, worse since drinking two cups of contrast for CT.  Denies nausea.  Declines pain medication.  Pt is A&O, NAD, RRR, CTAB, abd distended, soft, diffusely tender to palpation without guarding or rebound, left upper arm AV fistula with palpable thrill.  Pt reports he thinks he is going to have diarrhea.    3:02 PM Pt signed out to Fayrene Helper, PA-C, who assumes care of patient at change of shift.  Pending CT, discussion with renal regarding timing of next dialysis session.    [place], [place] 01/04/12 1529

## 2013-01-26 ENCOUNTER — Encounter (HOSPITAL_COMMUNITY): Payer: Self-pay | Admitting: Emergency Medicine

## 2013-01-26 ENCOUNTER — Emergency Department (INDEPENDENT_AMBULATORY_CARE_PROVIDER_SITE_OTHER)
Admission: EM | Admit: 2013-01-26 | Discharge: 2013-01-26 | Disposition: A | Discharge status: Home / Self Care | Payer: Medicare Other | Source: Home / Self Care | Attending: Family Medicine | Admitting: Family Medicine

## 2013-01-26 DIAGNOSIS — IMO0002 Reserved for concepts with insufficient information to code with codable children: Secondary | ICD-10-CM

## 2013-01-26 DIAGNOSIS — S76912A Strain of unspecified muscles, fascia and tendons at thigh level, left thigh, initial encounter: Secondary | ICD-10-CM

## 2013-01-26 MED ORDER — HYDROCODONE-ACETAMINOPHEN 5-325 MG PO TABS: 1.0000 | ORAL_TABLET | Freq: Four times a day (QID) | ORAL | Status: DC | PRN

## 2013-01-26 NOTE — ED Notes (Signed)
Patient stated he pulled a muscle in his left thigh. Occurred on Thursday preventing from falling. 9/10. Stated he tried Ibuprofen/Tylenol with no relief. Feels soreness in hip and shoulder. Written by: Marga Melnick

## 2013-01-26 NOTE — ED Provider Notes (Signed)
CSN: [id number]     Arrival date & time 01/26/13  4098 History   First MD Initiated Contact with Patient 01/26/13 1134     Chief Complaint  Patient presents with  . Muscle Pain   (Consider location/radiation/quality/duration/timing/severity/associated sxs/prior Treatment) HPI Comments: Patient presents for evaluation of a 6 day history of left thigh pain following an injury at home. States he was walking past his sofa when his left foot became tangled in the slip cover causing his to stumble and "pull a muscle" in his left thigh. States he did not fall but that he has had persistent left thigh pain since injury. States he has been too uncomfortable to sit at dialysis or to sleep at night since injury and has not completed a full HD treatment for one week. Went yesterday for 2 hours and on Saturday 12/13 for 2 hours. Is ambulatory without difficulty or assistance. States the use of ibuprofen has brought him no relief and that he is here for "a pain pill."  The history is provided by the patient.    Past Medical History  Diagnosis Date  . Renal disorder   . Hypertension   . GERD (gastroesophageal reflux disease)   . Dialysis patient     Tu/Th/Sat   Past Surgical History  Procedure Laterality Date  . Av fistula placement      left arm   History reviewed. No pertinent family history. History  Substance Use Topics  . Smoking status: Current Some Day Smoker  . Smokeless tobacco: Not on file  . Alcohol Use: No    Review of Systems  All other systems reviewed and are negative.    Allergies  Review of patient's allergies indicates no known allergies.  Home Medications   Current Outpatient Rx  Name  Route  Sig  Dispense  Refill  . amLODipine (NORVASC) 10 MG tablet   Oral   Take 10 mg by mouth daily.           . calcium acetate, Phos Binder, (PHOSLYRA) 667 MG/5ML SOLN   Oral   Take by mouth 3 (three) times daily with meals.           . doxazosin (CARDURA) 8 MG tablet   Oral   Take 8 mg by mouth at bedtime.         Marland Kitchen labetalol (NORMODYNE) 100 MG tablet   Oral   Take 200 mg by mouth 2 (two) times daily.          . multivitamin (RENA-VIT) TABS tablet   Oral   Take 1 tablet by mouth daily.           Marland Kitchen omeprazole (PRILOSEC) 40 MG capsule   Oral   Take 40 mg by mouth daily.         . ondansetron (ZOFRAN) 4 MG tablet   Oral   Take 1 tablet (4 mg total) by mouth every 6 (six) hours.   12 tablet   0    BP 155/99  Pulse 64  Temp(Src) 97.5 F (36.4 C) (Oral)  Resp 18  SpO2 98% Physical Exam  Nursing note and vitals reviewed. Constitutional: He is oriented to person, place, and time. He appears well-developed and well-nourished. No distress.  HENT:  Head: Normocephalic and atraumatic.  Eyes: Conjunctivae are normal.  Cardiovascular: Normal rate and regular rhythm.   Pulmonary/Chest: Effort normal and breath sounds normal.  Abdominal: Soft. There is no tenderness.  Musculoskeletal: Normal range of motion.  Legs: Ambulatory around exam room without difficulty or discomfort.   Neurological: He is alert and oriented to person, place, and time.  Skin: Skin is warm and dry. No rash noted. No erythema. No pallor.  No skin changes, ecchymosis or STS at left thigh.     ED Course  Procedures (including critical care time) Labs Review Labs Reviewed - No data to display Imaging Review No results found.  EKG Interpretation    Date/Time:    Ventricular Rate:    PR Interval:    QRS Duration:   QT Interval:    QTC Calculation:   R Axis:     Text Interpretation:              MDM   Case discussed with Dr. Denyse Amass because patient's description of his pain seems out of proportion to his unremarkable physical exam. Patient became very abrasive and agitated toward staff when he was asked to put on gown so that area of discomfort could be examined. Demanded repeatedly during exam process that he "just pulled muscle and I just need  a pain pill." I expressed my concern that it had been several days since he had completed dialysis and offered to check his potassium level to determine if he needed dialysis today. He refused lab work, again stating he just needed pain medication. [place] Controlled substances website revealed no narcotics prescriptions having been filled in last 6 months. I strongly encouraged him to try to complete dialysis tomorrow and that we would only provide limited amount of pain management. Encouraged him to follow up with his PCP should his symptoms persist.    Jess Barters [place], [place] 01/26/13 1335

## 2013-01-27 NOTE — ED Provider Notes (Signed)
Medical screening examination/treatment/procedure(s) were performed by a resident physician or non-physician practitioner and as the supervising physician I was immediately available for consultation/collaboration.  Clementeen Graham, MD   Rodolph Bong, MD 01/27/13 [phone]

## 2013-03-09 ENCOUNTER — Emergency Department (INDEPENDENT_AMBULATORY_CARE_PROVIDER_SITE_OTHER)
Admission: EM | Admit: 2013-03-09 | Discharge: 2013-03-09 | Disposition: A | Discharge status: Home / Self Care | Payer: Medicare Other | Source: Home / Self Care | Attending: Family Medicine | Admitting: Family Medicine

## 2013-03-09 ENCOUNTER — Encounter (HOSPITAL_COMMUNITY): Payer: Self-pay | Admitting: Emergency Medicine

## 2013-03-09 ENCOUNTER — Emergency Department (INDEPENDENT_AMBULATORY_CARE_PROVIDER_SITE_OTHER): Payer: Medicare Other

## 2013-03-09 DIAGNOSIS — J189 Pneumonia, unspecified organism: Secondary | ICD-10-CM

## 2013-03-09 MED ORDER — LEVOFLOXACIN 500 MG PO TABS: ORAL_TABLET | ORAL | Status: DC

## 2013-03-09 MED ORDER — AMOXICILLIN-POT CLAVULANATE 250-62.5 MG/5ML PO SUSR: 250.0000 mg | Freq: Two times a day (BID) | ORAL | Status: AC

## 2013-03-09 NOTE — ED Notes (Signed)
Pt. opened door and said he was leaving.  I checked with PA and she was talking to Dr. Lorenz CoasterKeller about his plan of care. I told pt. She is talking to the doctor now about how to treat him.

## 2013-03-09 NOTE — Discharge Instructions (Signed)

## 2013-03-09 NOTE — ED Notes (Signed)
Pt  Is  A  Dialysis pt  He  Reports  He  As  dialysed  Last  Sat  He  Is  Due  tommorow    He  Is  [place]   He  Reports  Symptoms  Of  Cough       Body  Aches  Decreased  Appetite and  A  Cough   X  3  Days  He  Has  A  Shunt in Left  Arm      He  Reports  His  Girlfriend  Has  The  Flu

## 2013-03-09 NOTE — ED Provider Notes (Signed)
CSN: [id number]     Arrival date & time 03/09/13  1600 History   First MD Initiated Contact with Patient 03/09/13 1654     Chief Complaint  Patient presents with  . Generalized Body Aches   (Consider location/radiation/quality/duration/timing/severity/associated sxs/prior Treatment) Patient is a 63 y.o. male presenting with cough. The history is provided by the patient. No language interpreter was used.  Cough Cough characteristics:  Non-productive Severity:  Moderate Onset quality:  Gradual Duration:  10 days Timing:  Constant Progression:  Worsening Chronicity:  New Smoker: no   Context: upper respiratory infection   Relieved by:  Nothing Worsened by:  Nothing tried Ineffective treatments:  None tried Associated symptoms: no chest pain     Past Medical History  Diagnosis Date  . Renal disorder   . Hypertension   . GERD (gastroesophageal reflux disease)   . Dialysis patient     Tu/Th/Sat   Past Surgical History  Procedure Laterality Date  . Av fistula placement      left arm   History reviewed. No pertinent family history. History  Substance Use Topics  . Smoking status: Current Some Day Smoker  . Smokeless tobacco: Not on file  . Alcohol Use: No    Review of Systems  Respiratory: Positive for cough.   Cardiovascular: Negative for chest pain.  All other systems reviewed and are negative.    Allergies  Review of patient's allergies indicates no known allergies.  Home Medications   Current Outpatient Rx  Name  Route  Sig  Dispense  Refill  . amLODipine (NORVASC) 10 MG tablet   Oral   Take 10 mg by mouth daily.           Marland Kitchen amoxicillin-clavulanate (AUGMENTIN) 250-62.5 MG/5ML suspension   Oral   Take 5 mLs (250 mg total) by mouth 2 (two) times daily.   100 mL   0   . calcium acetate, Phos Binder, (PHOSLYRA) 667 MG/5ML SOLN   Oral   Take by mouth 3 (three) times daily with meals.           . doxazosin (CARDURA) 8 MG tablet   Oral   Take 8 mg  by mouth at bedtime.         Marland Kitchen HYDROcodone-acetaminophen (NORCO/VICODIN) 5-325 MG per tablet   Oral   Take 1-2 tablets by mouth every 6 (six) hours as needed for moderate pain.   10 tablet   0   . labetalol (NORMODYNE) 100 MG tablet   Oral   Take 200 mg by mouth 2 (two) times daily.          Marland Kitchen levofloxacin (LEVAQUIN) 500 MG tablet      One tablet every  Day   5 tablet   0   . multivitamin (RENA-VIT) TABS tablet   Oral   Take 1 tablet by mouth daily.           Marland Kitchen omeprazole (PRILOSEC) 40 MG capsule   Oral   Take 40 mg by mouth daily.         . ondansetron (ZOFRAN) 4 MG tablet   Oral   Take 1 tablet (4 mg total) by mouth every 6 (six) hours.   12 tablet   0    BP 200/101  Pulse 76  Temp(Src) 98 F (36.7 C) (Oral)  Resp 18  SpO2 97% Physical Exam  Nursing note and vitals reviewed. Constitutional: He is oriented to person, place, and time. He appears well-developed and well-nourished.  HENT:  Head: Normocephalic and atraumatic.  Eyes: EOM are normal. Pupils are equal, round, and reactive to light.  Neck: Normal range of motion.  Cardiovascular: Normal rate and normal heart sounds.   Pulmonary/Chest: Effort normal and breath sounds normal. He has no wheezes.  Abdominal: He exhibits no distension.  Musculoskeletal: Normal range of motion.  Neurological: He is alert and oriented to person, place, and time.  Skin: Skin is warm.  Psychiatric: He has a normal mood and affect.    ED Course  Procedures (including critical care time) Labs Review Labs Reviewed - No data to display Imaging Review Dg Chest 2 View  03/09/2013   CLINICAL DATA:  Generalized body aches.  EXAM: CHEST  2 VIEW  COMPARISON:  01/04/2012  FINDINGS: Lungs are adequately inflated with stable chronic changes over the left base. There is mild right base opacification which may be due to atelectasis or infection. There is a suggestion of a small amount of right pleural fluid. There is mild stable  cardiomegaly. There is a mild to moderate T8 compression fracture unchanged.  IMPRESSION: Right basilar opacification which may be due to atelectasis versus infection. Possible small amount right pleural fluid.  Stable chronic changes left lung base.  Stable cardiomegaly.  Stable T8 compression fracture.   Electronically Signed   By: Elberta Fortisaniel  Boyle M.D.   On: 03/09/2013 17:43    EKG Interpretation    Date/Time:    Ventricular Rate:    PR Interval:    QRS Duration:   QT Interval:    QTC Calculation:   R Axis:     Text Interpretation:              MDM   1. Community acquired pneumonia    Port III  outpt treatment reasonable.   Pt looks good.   I spoke to pharmacist.   Augmentin 250 bid and levaquin 500mg  every other day.    Pt advised follow up with his MD .   Go to Ed is symptoms worsen or cahnge    Elson AreasLeslie K Sofia, PA-C 03/09/13 1845

## 2013-03-09 NOTE — ED Provider Notes (Signed)
Medical screening examination/treatment/procedure(s) were performed by a resident physician or non-physician practitioner and as the supervising physician I was immediately available for consultation/collaboration.  Cameren Earnest, MD    Wallace Cogliano S Shernell Saldierna, MD 03/09/13 1951 

## 2013-03-09 NOTE — ED Notes (Signed)
Corey Lamb said to tell pt. She is talking to the pharmacy about the correct dose of medicine for him since he is a dialysis pt.  I told pt. 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 I asked him to be patient a few more minutes, so I can give him his d/c instructions. He said he can get that from the pharmacy.  I explained these are his instructions from here and he needs to sign that he received them.

## 2013-03-09 NOTE — ED Notes (Signed)
Pt     While in  X  Ray           Stated  His  Legs  Felt  Numb             And      He  States  Because   He  Was     Was   Sitting    In a   Wheelchair           He remained  Awake  And  Alert  He  Did  Not black  Out           He  Remained  Alert and  Oriented

## 2013-03-11 ENCOUNTER — Encounter (HOSPITAL_COMMUNITY): Payer: Self-pay | Admitting: Emergency Medicine

## 2013-03-11 ENCOUNTER — Emergency Department (INDEPENDENT_AMBULATORY_CARE_PROVIDER_SITE_OTHER)
Admission: EM | Admit: 2013-03-11 | Discharge: 2013-03-11 | Disposition: A | Discharge status: Home / Self Care | Payer: Medicare Other | Source: Home / Self Care | Attending: Family Medicine | Admitting: Family Medicine

## 2013-03-11 DIAGNOSIS — M5431 Sciatica, right side: Secondary | ICD-10-CM

## 2013-03-11 DIAGNOSIS — M5430 Sciatica, unspecified side: Secondary | ICD-10-CM

## 2013-03-11 DIAGNOSIS — M5432 Sciatica, left side: Principal | ICD-10-CM

## 2013-03-11 MED ORDER — PREDNISONE 20 MG PO TABS: 40.0000 mg | ORAL_TABLET | Freq: Every day | ORAL | Status: DC

## 2013-03-11 MED ORDER — CYCLOBENZAPRINE HCL 10 MG PO TABS: 10.0000 mg | ORAL_TABLET | Freq: Three times a day (TID) | ORAL | Status: DC | PRN

## 2013-03-11 MED ORDER — PREDNISONE 20 MG PO TABS
ORAL_TABLET | ORAL | Status: AC
  Filled 2013-03-11: qty 3

## 2013-03-11 MED ORDER — OXYCODONE-ACETAMINOPHEN 5-325 MG PO TABS: 1.0000 | ORAL_TABLET | ORAL | Status: DC | PRN

## 2013-03-11 MED ORDER — PREDNISONE 20 MG PO TABS
60.0000 mg | ORAL_TABLET | Freq: Once | ORAL | Status: AC
  Administered 2013-03-11: 60 mg via ORAL

## 2013-03-11 NOTE — Discharge Instructions (Signed)
Take medications as directed. Avoid heavy lifting or strenuous activity for a week. Continue the medications you were prescribed 03/09/2013. If your back pain and bilateral leg pain do not improve we have provided a referral to an orthopedic physician with whom you can arrange follow up. We have also provide the [facility] for you to call and arrange follow up with a primary care physician.   Sciatica Sciatica is pain, weakness, numbness, or tingling along your sciatic nerve. The nerve starts in the lower back and runs down the back of each leg. Nerve damage or certain conditions pinch or put pressure on the sciatic nerve. This causes the pain, weakness, and other discomforts of sciatica. HOME CARE   Only take medicine as told by your doctor.  Apply ice to the affected area for 20 minutes. Do this 3 4 times a day for the first 48 72 hours. Then try heat in the same way.  Exercise, stretch, or do your usual activities if these do not make your pain worse.  Go to physical therapy as told by your doctor.  Keep all doctor visits as told.  Do not wear high heels or shoes that are not supportive.  Get a firm mattress if your mattress is too soft to lessen pain and discomfort. GET HELP RIGHT AWAY IF:   You cannot control when you poop (bowel movement) or pee (urinate).  You have more weakness in your lower back, lower belly (pelvis), butt (buttocks), or legs.  You have redness or puffiness (swelling) of your back.  You have a burning feeling when you pee.  You have pain that gets worse when you lie down.  You have pain that wakes you from your sleep.  Your pain is worse than past pain.  Your pain lasts longer than 4 weeks.  You are suddenly losing weight without reason. MAKE SURE YOU:   Understand these instructions.  Will watch this condition.  Will get help right away if you are not doing well or get worse. Document Released:  11/06/2007 Document Revised: 07/29/2011 Document Reviewed: 06/08/2011 [facility] Patient Information 2014 [employer] [place].

## 2013-03-11 NOTE — ED Notes (Signed)
C/o  Right flank pain, denies urinary symptoms.  Pt is on dialysis.  Bilateral leg pain since dec that is gradually getting worse.   Pt has tried otc pain meds and heating pad with no relief.   Denies any other symptoms.

## 2013-03-15 NOTE — ED Provider Notes (Signed)
CSN: [id number]     Arrival date & time 03/11/13  1431 History   First MD Initiated Contact with Patient 03/11/13 1546     Chief Complaint  Patient presents with  . Flank Pain  . Leg Pain    Patient is a 63 y.o. male presenting with leg pain and back pain. The history is provided by the patient.  Leg Pain Associated symptoms: back pain   Associated symptoms: no fever   Back Pain Location:  Lumbar spine Quality:  Aching and stiffness Stiffness is present:  All day Radiates to:  R posterior upper leg and L posterior upper leg Pain severity:  Moderate Pain is:  Same all the time Onset quality:  Gradual Duration:  1 month Timing:  Intermittent Progression:  Waxing and waning Chronicity:  New Context: emotional stress and lifting heavy objects   Context: not falling and not jumping from heights   Associated symptoms: leg pain   Associated symptoms: no bladder incontinence, no bowel incontinence, no dysuria, no fever, no numbness, no paresthesias, no pelvic pain, no perianal numbness, no tingling and no weakness   Pt reports persistent but intermittent LBP since Dec that has recently worsened. 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 The pain is across his entire lumbar region R>L and he states it is a constant ache w/ intermittent sharp pains. Recently the pain has started to radiate into BLE's to the posterior knee. None of the OTC remedies he has tried have helped. Pt recently seen here and treated for PNA. States he is still taking medications. Pt w/ h/o ESRD and is HD on Tuesday, Thursday and Saturday.  Past Medical History  Diagnosis Date  . Renal disorder   . Hypertension   . GERD (gastroesophageal reflux disease)   . Dialysis patient     Tu/Th/Sat   Past Surgical History  Procedure Laterality Date  . Av fistula placement      left arm   History reviewed. No  pertinent family history. History  Substance Use Topics  . Smoking status: Current Some Day Smoker  . Smokeless tobacco: Not on file  . Alcohol Use: No    Review of Systems  Constitutional: Negative for fever.  HENT: Positive for congestion.   Eyes: Negative.   Respiratory: Positive for cough.   Cardiovascular: Negative.   Gastrointestinal: Negative.  Negative for bowel incontinence.  Endocrine: Negative.   Genitourinary: Negative.  Negative for bladder incontinence, dysuria and pelvic pain.  Musculoskeletal: Positive for back pain.  Skin: Negative.   Allergic/Immunologic: Negative.   Neurological: Negative for tingling, weakness, numbness and paresthesias.  Hematological: Negative.   Psychiatric/Behavioral: Negative.     Allergies  Review of patient's allergies indicates no known allergies.  Home Medications   Current Outpatient Rx  Name  Route  Sig  Dispense  Refill  . amLODipine (NORVASC) 10 MG tablet   Oral   Take 10 mg by mouth daily.           . calcium acetate, Phos Binder, (PHOSLYRA) 667 MG/5ML SOLN   Oral   Take by mouth 3 (three) times daily with meals.           . doxazosin (CARDURA) 8 MG tablet   Oral   Take 8 mg by mouth at bedtime.         Marland Kitchen. labetalol (NORMODYNE) 100 MG tablet  Oral   Take 200 mg by mouth 2 (two) times daily.          Marland Kitchen amoxicillin-clavulanate (AUGMENTIN) 250-62.5 MG/5ML suspension   Oral   Take 5 mLs (250 mg total) by mouth 2 (two) times daily.   100 mL   0   . cyclobenzaprine (FLEXERIL) 10 MG tablet   Oral   Take 1 tablet (10 mg total) by mouth 3 (three) times daily as needed for muscle spasms (and or back/leg pain).   20 tablet   0   . HYDROcodone-acetaminophen (NORCO/VICODIN) 5-325 MG per tablet   Oral   Take 1-2 tablets by mouth every 6 (six) hours as needed for moderate pain.   10 tablet   0   . levofloxacin (LEVAQUIN) 500 MG tablet      One tablet every  Day   5 tablet   0   . multivitamin  (RENA-VIT) TABS tablet   Oral   Take 1 tablet by mouth daily.           Marland Kitchen omeprazole (PRILOSEC) 40 MG capsule   Oral   Take 40 mg by mouth daily.         . ondansetron (ZOFRAN) 4 MG tablet   Oral   Take 1 tablet (4 mg total) by mouth every 6 (six) hours.   12 tablet   0   . oxyCODONE-acetaminophen (PERCOCET/ROXICET) 5-325 MG per tablet   Oral   Take 1 tablet by mouth every 4 (four) hours as needed for severe pain.   10 tablet   0   . predniSONE (DELTASONE) 20 MG tablet   Oral   Take 2 tablets (40 mg total) by mouth daily with breakfast. For 5 days   10 tablet   0    BP 192/100  Pulse 74  Temp(Src) 98.1 F (36.7 C) (Oral)  Resp 16  SpO2 97% Physical Exam  Constitutional: He is oriented to person, place, and time. He appears well-developed and well-nourished.  HENT:  Head: Normocephalic and atraumatic.  Eyes: Conjunctivae are normal.  Neck: Neck supple.  Cardiovascular: Normal rate and regular rhythm.   Pulmonary/Chest: Effort normal and breath sounds normal.  Musculoskeletal: Normal range of motion. He exhibits tenderness.       Back:  TTP to entire lumbar region R>L  Neurological: He is alert and oriented to person, place, and time. Coordination and gait normal.  Skin: Skin is warm and dry.  Psychiatric: He has a normal mood and affect.    ED Course  Procedures (including critical care time) Labs Review Labs Reviewed - No data to display Imaging Review No results found.    MDM   1. Bilateral sciatica    LBP x 1 month recently w/ pain radiating into bil posterior LE's. Sx's c/w bil Sciatica. Will treat w/ Flexeril, Prednisone and short course of med for pain. Ortho referral given for pt to arrange f/u if pain worsens or does not improve. Referral to [facility] per pt request as he is trying to get established w/ PCP here in [place]. Pt agreeable w/ plan.    Roma Kayser Jeyli Zwicker, NP 03/16/13 [phone]

## 2013-03-22 ENCOUNTER — Encounter (HOSPITAL_COMMUNITY): Payer: Self-pay | Admitting: Emergency Medicine

## 2013-03-22 ENCOUNTER — Emergency Department (HOSPITAL_COMMUNITY): Payer: Medicare Other

## 2013-03-22 ENCOUNTER — Inpatient Hospital Stay (HOSPITAL_COMMUNITY)
Admission: EM | Admit: 2013-03-22 | Discharge: 2013-03-24 | DRG: 070 | Disposition: A | Discharge status: Home / Self Care | Payer: Medicare Other | Attending: Internal Medicine | Admitting: Internal Medicine

## 2013-03-22 DIAGNOSIS — N19 Unspecified kidney failure: Secondary | ICD-10-CM

## 2013-03-22 DIAGNOSIS — M899 Disorder of bone, unspecified: Secondary | ICD-10-CM | POA: Diagnosis present

## 2013-03-22 DIAGNOSIS — N2581 Secondary hyperparathyroidism of renal origin: Secondary | ICD-10-CM | POA: Diagnosis present

## 2013-03-22 DIAGNOSIS — Z91158 Patient's noncompliance with renal dialysis for other reason: Secondary | ICD-10-CM

## 2013-03-22 DIAGNOSIS — Z9119 Patient's noncompliance with other medical treatment and regimen: Secondary | ICD-10-CM

## 2013-03-22 DIAGNOSIS — G9341 Metabolic encephalopathy: Principal | ICD-10-CM | POA: Diagnosis present

## 2013-03-22 DIAGNOSIS — E8770 Fluid overload, unspecified: Secondary | ICD-10-CM | POA: Diagnosis present

## 2013-03-22 DIAGNOSIS — K219 Gastro-esophageal reflux disease without esophagitis: Secondary | ICD-10-CM | POA: Diagnosis present

## 2013-03-22 DIAGNOSIS — I10 Essential (primary) hypertension: Secondary | ICD-10-CM | POA: Diagnosis present

## 2013-03-22 DIAGNOSIS — I120 Hypertensive chronic kidney disease with stage 5 chronic kidney disease or end stage renal disease: Secondary | ICD-10-CM | POA: Diagnosis present

## 2013-03-22 DIAGNOSIS — D649 Anemia, unspecified: Secondary | ICD-10-CM | POA: Diagnosis present

## 2013-03-22 DIAGNOSIS — G9349 Other encephalopathy: Secondary | ICD-10-CM

## 2013-03-22 DIAGNOSIS — R634 Abnormal weight loss: Secondary | ICD-10-CM | POA: Diagnosis present

## 2013-03-22 DIAGNOSIS — Z9115 Patient's noncompliance with renal dialysis: Secondary | ICD-10-CM

## 2013-03-22 DIAGNOSIS — R0602 Shortness of breath: Secondary | ICD-10-CM

## 2013-03-22 DIAGNOSIS — J811 Chronic pulmonary edema: Secondary | ICD-10-CM | POA: Diagnosis present

## 2013-03-22 DIAGNOSIS — Z91199 Patient's noncompliance with other medical treatment and regimen due to unspecified reason: Secondary | ICD-10-CM

## 2013-03-22 DIAGNOSIS — N186 End stage renal disease: Secondary | ICD-10-CM | POA: Diagnosis present

## 2013-03-22 DIAGNOSIS — F17200 Nicotine dependence, unspecified, uncomplicated: Secondary | ICD-10-CM | POA: Diagnosis present

## 2013-03-22 DIAGNOSIS — M949 Disorder of cartilage, unspecified: Secondary | ICD-10-CM

## 2013-03-22 DIAGNOSIS — Z992 Dependence on renal dialysis: Secondary | ICD-10-CM

## 2013-03-22 DIAGNOSIS — E875 Hyperkalemia: Secondary | ICD-10-CM | POA: Diagnosis present

## 2013-03-22 HISTORY — DX: Hyperkalemia: E87.5

## 2013-03-22 LAB — GLUCOSE, CAPILLARY: Glucose-Capillary: 67 mg/dL — ABNORMAL LOW (ref 70–99)

## 2013-03-22 MED ORDER — SODIUM CHLORIDE 0.9 % IV SOLN
1.0000 g | Freq: Once | INTRAVENOUS | Status: DC
  Filled 2013-03-22: qty 10

## 2013-03-22 MED ORDER — INSULIN ASPART 100 UNIT/ML ~~LOC~~ SOLN
5.0000 [IU] | Freq: Once | SUBCUTANEOUS | Status: DC
  Filled 2013-03-22: qty 1

## 2013-03-22 MED ORDER — DEXTROSE 50 % IV SOLN
50.0000 mL | Freq: Once | INTRAVENOUS | Status: DC
  Filled 2013-03-22: qty 50

## 2013-03-22 NOTE — ED Notes (Signed)
ER Physician at the bedside

## 2013-03-22 NOTE — ED Notes (Signed)
MD made aware of CBG of 68

## 2013-03-22 NOTE — ED Notes (Signed)
Per EMS, they were called because patient had pain in legs, and could not hold himself up to walk.  He has not been able to walk for the last hour and a half.  The wife reported to EMS that the patient hasn't walked since last night.  He took no blood pressure medications tonight, and hasn't had dialysis since Saturday.  The patient currently has vague responses. Capillary blood glucose reading was 75 with EMS, and blood pressure was 210/106.  Heart rate was 62.  Patient has AV fistula in left arm.  18 gauge in right AC.

## 2013-03-22 NOTE — ED Provider Notes (Signed)
CSN: [id number]     Arrival date & time 03/22/13  2314 History   First MD Initiated Contact with Patient 03/22/13 2334     Chief Complaint  Patient presents with  . Weakness     (Consider location/radiation/quality/duration/timing/severity/associated sxs/prior Treatment) HPI Patient is a poor historian. Level V caveat applies. Patient presents from home via EMS for generalized weakness and altered mental status. Per EMS note the patient has not been able to walk due to weakness. Patient is on hemodialysis Tuesday Thursday and Saturdays. He was last dialyzed Saturday. He specifically states he is does not have any pain in his chest or abdomen. He does have some shortness of breath with any exertion. Past Medical History  Diagnosis Date  . Renal disorder   . Hypertension   . GERD (gastroesophageal reflux disease)   . Dialysis patient     Tu/Th/Sat   Past Surgical History  Procedure Laterality Date  . Av fistula placement      left arm   History reviewed. No pertinent family history. History  Substance Use Topics  . Smoking status: Current Some Day Smoker    Types: Cigarettes  . Smokeless tobacco: Not on file  . Alcohol Use: No    Review of Systems  Constitutional: Positive for activity change and fatigue. Negative for fever and chills.  Eyes: Photophobia: generalized.  Respiratory: Positive for shortness of breath.   Cardiovascular: Negative for chest pain.  Gastrointestinal: Negative for nausea, vomiting, abdominal pain and diarrhea.  Musculoskeletal: Positive for myalgias.  Neurological: Positive for weakness. Negative for headaches.      Allergies  Review of patient's allergies indicates no known allergies.  Home Medications   Current Outpatient Rx  Name  Route  Sig  Dispense  Refill  . amLODipine (NORVASC) 10 MG tablet   Oral   Take 10 mg by mouth daily.           . calcium acetate, Phos Binder, (PHOSLYRA) 667 MG/5ML SOLN   Oral   Take by mouth 3 (three)  times daily with meals.           . cyclobenzaprine (FLEXERIL) 10 MG tablet   Oral   Take 1 tablet (10 mg total) by mouth 3 (three) times daily as needed for muscle spasms (and or back/leg pain).   20 tablet   0   . doxazosin (CARDURA) 8 MG tablet   Oral   Take 8 mg by mouth at bedtime.         Marland Kitchen HYDROcodone-acetaminophen (NORCO/VICODIN) 5-325 MG per tablet   Oral   Take 1-2 tablets by mouth every 6 (six) hours as needed for moderate pain.   10 tablet   0   . labetalol (NORMODYNE) 100 MG tablet   Oral   Take 200 mg by mouth 2 (two) times daily.          Marland Kitchen levofloxacin (LEVAQUIN) 500 MG tablet      One tablet every  Day   5 tablet   0   . multivitamin (RENA-VIT) TABS tablet   Oral   Take 1 tablet by mouth daily.           Marland Kitchen omeprazole (PRILOSEC) 40 MG capsule   Oral   Take 40 mg by mouth daily.         . ondansetron (ZOFRAN) 4 MG tablet   Oral   Take 1 tablet (4 mg total) by mouth every 6 (six) hours.   12 tablet  0   . oxyCODONE-acetaminophen (PERCOCET/ROXICET) 5-325 MG per tablet   Oral   Take 1 tablet by mouth every 4 (four) hours as needed for severe pain.   10 tablet   0   . predniSONE (DELTASONE) 20 MG tablet   Oral   Take 2 tablets (40 mg total) by mouth daily with breakfast. For 5 days   10 tablet   0    BP 211/113  Pulse 66  Temp(Src) 98.1 F (36.7 C) (Oral)  Ht 5\' 9"  (1.753 m)  SpO2 95% Physical Exam  Nursing note and vitals reviewed. Constitutional: He is oriented to person, place, and time. He appears well-developed and well-nourished. No distress.  Patient appears very dry and mildly lethargic  HENT:  Head: Normocephalic and atraumatic.  Mouth/Throat: Oropharynx is clear and moist. No oropharyngeal exudate.  Eyes: EOM are normal. Pupils are equal, round, and reactive to light.  Neck: Normal range of motion. Neck supple.  No meningismus  Cardiovascular: Normal rate and regular rhythm.   Pulmonary/Chest: Effort normal. No  respiratory distress. He has no wheezes. He has rales (crackles bilaterally in lower lung fields.). He exhibits no tenderness.  Abdominal: Soft. Bowel sounds are normal. He exhibits no distension and no mass. There is no tenderness. There is no rebound and no guarding.  Musculoskeletal: Normal range of motion. He exhibits no edema and no tenderness.  Neurological: He is oriented to person, place, and time.  Patient is oriented x3. As stated previously is mildly lethargic but will answer questions and follow simple commands. Moves all extremities without any focal deficits. Patient is grossly intact.  Skin: Skin is warm and dry. No rash noted. No erythema.    ED Course  Procedures (including critical care time) Labs Review Labs Reviewed  CBC WITH DIFFERENTIAL  COMPREHENSIVE METABOLIC PANEL  PRO B NATRIURETIC PEPTIDE  TROPONIN I   Imaging Review No results found.  EKG Interpretation   None       MDM   Final diagnoses:  None    Concern for fluid overload and hyperkalemia. EKGs are concerning for hyperkalemia as well. We'll go ahead and preemptively treat with calcium gluconate. Patient will need to be admitted for emergent dialysis.   This with Dr. Eliott Nineunham. Will place hemodialysis orders. Asked to have the hospitalist admit.  Loren Raceravid Randie Tallarico, MD 03/23/13 [id number]

## 2013-03-23 ENCOUNTER — Encounter (HOSPITAL_COMMUNITY): Payer: Self-pay | Admitting: General Practice

## 2013-03-23 DIAGNOSIS — Z9115 Patient's noncompliance with renal dialysis: Secondary | ICD-10-CM

## 2013-03-23 DIAGNOSIS — E8770 Fluid overload, unspecified: Secondary | ICD-10-CM | POA: Diagnosis present

## 2013-03-23 DIAGNOSIS — N186 End stage renal disease: Secondary | ICD-10-CM | POA: Diagnosis present

## 2013-03-23 DIAGNOSIS — E8779 Other fluid overload: Secondary | ICD-10-CM

## 2013-03-23 DIAGNOSIS — Z992 Dependence on renal dialysis: Secondary | ICD-10-CM

## 2013-03-23 DIAGNOSIS — N19 Unspecified kidney failure: Secondary | ICD-10-CM

## 2013-03-23 DIAGNOSIS — G9341 Metabolic encephalopathy: Secondary | ICD-10-CM | POA: Diagnosis present

## 2013-03-23 DIAGNOSIS — I10 Essential (primary) hypertension: Secondary | ICD-10-CM

## 2013-03-23 LAB — COMPREHENSIVE METABOLIC PANEL
ALK PHOS: 59 U/L (ref 39–117)
ALT: 21 U/L (ref 0–53)
AST: 18 U/L (ref 0–37)
Albumin: 3.4 g/dL — ABNORMAL LOW (ref 3.5–5.2)
BUN: 112 mg/dL — ABNORMAL HIGH (ref 6–23)
CHLORIDE: 93 meq/L — AB (ref 96–112)
CO2: 23 meq/L (ref 19–32)
Calcium: 8.7 mg/dL (ref 8.4–10.5)
Creatinine, Ser: 17.09 mg/dL — ABNORMAL HIGH (ref 0.50–1.35)
GFR, EST AFRICAN AMERICAN: 3 mL/min — AB (ref >90)
GFR, EST NON AFRICAN AMERICAN: 3 mL/min — AB (ref >90)
GLUCOSE: 78 mg/dL (ref 70–99)
SODIUM: 138 meq/L (ref 137–147)
Total Bilirubin: 0.5 mg/dL (ref 0.3–1.2)
Total Protein: 6.9 g/dL (ref 6.0–8.3)

## 2013-03-23 LAB — BASIC METABOLIC PANEL
BUN: 37 mg/dL — ABNORMAL HIGH (ref 6–23)
CO2: 27 meq/L (ref 19–32)
Calcium: 9 mg/dL (ref 8.4–10.5)
Chloride: 93 mEq/L — ABNORMAL LOW (ref 96–112)
Creatinine, Ser: 8.28 mg/dL — ABNORMAL HIGH (ref 0.50–1.35)
GFR calc Af Amer: 7 mL/min — ABNORMAL LOW (ref >90)
GFR calc non Af Amer: 6 mL/min — ABNORMAL LOW (ref >90)
GLUCOSE: 82 mg/dL (ref 70–99)
Potassium: 4.7 mEq/L (ref 3.7–5.3)
SODIUM: 140 meq/L (ref 137–147)

## 2013-03-23 LAB — CBC
HCT: 32.1 % — ABNORMAL LOW (ref 39.0–52.0)
Hemoglobin: 10.8 g/dL — ABNORMAL LOW (ref 13.0–17.0)
MCH: 28.1 pg (ref 26.0–34.0)
MCHC: 33.6 g/dL (ref 30.0–36.0)
MCV: 83.4 fL (ref 78.0–100.0)
PLATELETS: 143 10*3/uL — AB (ref 150–400)
RBC: 3.85 MIL/uL — ABNORMAL LOW (ref 4.22–5.81)
RDW: 17.8 % — ABNORMAL HIGH (ref 11.5–15.5)
WBC: 5.9 10*3/uL (ref 4.0–10.5)

## 2013-03-23 LAB — RENAL FUNCTION PANEL
ALBUMIN: 3.2 g/dL — AB (ref 3.5–5.2)
BUN: 113 mg/dL — ABNORMAL HIGH (ref 6–23)
CHLORIDE: 95 meq/L — AB (ref 96–112)
CO2: 23 mEq/L (ref 19–32)
Calcium: 8.8 mg/dL (ref 8.4–10.5)
Creatinine, Ser: 17.53 mg/dL — ABNORMAL HIGH (ref 0.50–1.35)
GFR calc non Af Amer: 2 mL/min — ABNORMAL LOW (ref >90)
GFR, EST AFRICAN AMERICAN: 3 mL/min — AB (ref >90)
Glucose, Bld: 83 mg/dL (ref 70–99)
Phosphorus: 7.4 mg/dL — ABNORMAL HIGH (ref 2.3–4.6)
SODIUM: 140 meq/L (ref 137–147)

## 2013-03-23 LAB — GLUCOSE, CAPILLARY
GLUCOSE-CAPILLARY: 138 mg/dL — AB (ref 70–99)
GLUCOSE-CAPILLARY: 71 mg/dL (ref 70–99)
GLUCOSE-CAPILLARY: 105 mg/dL — AB (ref 70–99)

## 2013-03-23 LAB — PRO B NATRIURETIC PEPTIDE: Pro B Natriuretic peptide (BNP): 34603 pg/mL — ABNORMAL HIGH (ref 0–125)

## 2013-03-23 LAB — CBC WITH DIFFERENTIAL/PLATELET
Basophils Absolute: 0 10*3/uL (ref 0.0–0.1)
Basophils Relative: 0 % (ref 0–1)
Eosinophils Absolute: 0.2 10*3/uL (ref 0.0–0.7)
Eosinophils Relative: 2 % (ref 0–5)
HCT: 30.9 % — ABNORMAL LOW (ref 39.0–52.0)
Hemoglobin: 10.6 g/dL — ABNORMAL LOW (ref 13.0–17.0)
LYMPHS ABS: 1 10*3/uL (ref 0.7–4.0)
LYMPHS PCT: 12 % (ref 12–46)
MCH: 28.9 pg (ref 26.0–34.0)
MCHC: 34.3 g/dL (ref 30.0–36.0)
MCV: 84.2 fL (ref 78.0–100.0)
Monocytes Absolute: 1.1 10*3/uL — ABNORMAL HIGH (ref 0.1–1.0)
Monocytes Relative: 13 % — ABNORMAL HIGH (ref 3–12)
NEUTROS ABS: 6.1 10*3/uL (ref 1.7–7.7)
NEUTROS PCT: 73 % (ref 43–77)
PLATELETS: 137 10*3/uL — AB (ref 150–400)
RBC: 3.67 MIL/uL — AB (ref 4.22–5.81)
RDW: 18 % — ABNORMAL HIGH (ref 11.5–15.5)
WBC: 8.3 10*3/uL (ref 4.0–10.5)

## 2013-03-23 LAB — MRSA PCR SCREENING: MRSA by PCR: NEGATIVE

## 2013-03-23 LAB — TROPONIN I: Troponin I: <0.3 ng/mL (ref <0.30)

## 2013-03-23 MED ORDER — PANTOPRAZOLE SODIUM 40 MG PO TBEC
40.0000 mg | DELAYED_RELEASE_TABLET | Freq: Every day | ORAL | Status: DC
  Administered 2013-03-23 – 2013-03-24 (×2): 40 mg via ORAL
  Filled 2013-03-23 (×2): qty 1

## 2013-03-23 MED ORDER — SODIUM CHLORIDE 0.9 % IV SOLN: 250.0000 mL | INTRAVENOUS | Status: DC | PRN

## 2013-03-23 MED ORDER — DARBEPOETIN ALFA-POLYSORBATE 25 MCG/0.42ML IJ SOLN
25.0000 ug | INTRAMUSCULAR | Status: DC
  Filled 2013-03-23: qty 0.42

## 2013-03-23 MED ORDER — CALCIUM ACETATE 667 MG PO CAPS
1334.0000 mg | ORAL_CAPSULE | Freq: Three times a day (TID) | ORAL | Status: DC
  Administered 2013-03-23 – 2013-03-24 (×3): 1334 mg via ORAL
  Filled 2013-03-23 (×6): qty 2

## 2013-03-23 MED ORDER — CLONIDINE HCL 0.2 MG PO TABS
0.2000 mg | ORAL_TABLET | Freq: Three times a day (TID) | ORAL | Status: DC
  Administered 2013-03-23 – 2013-03-24 (×3): 0.2 mg via ORAL
  Filled 2013-03-23 (×5): qty 1

## 2013-03-23 MED ORDER — SODIUM CHLORIDE 0.9 % IJ SOLN: 3.0000 mL | INTRAMUSCULAR | Status: DC | PRN

## 2013-03-23 MED ORDER — ONDANSETRON HCL 4 MG PO TABS: 4.0000 mg | ORAL_TABLET | Freq: Four times a day (QID) | ORAL | Status: DC

## 2013-03-23 MED ORDER — SODIUM CHLORIDE 0.9 % IJ SOLN: 3.0000 mL | Freq: Two times a day (BID) | INTRAMUSCULAR | Status: DC

## 2013-03-23 MED ORDER — OXYCODONE-ACETAMINOPHEN 5-325 MG PO TABS: 1.0000 | ORAL_TABLET | ORAL | Status: DC | PRN

## 2013-03-23 MED ORDER — RENA-VITE PO TABS
1.0000 | ORAL_TABLET | Freq: Every day | ORAL | Status: DC
  Administered 2013-03-23: 1 via ORAL
  Filled 2013-03-23 (×2): qty 1

## 2013-03-23 MED ORDER — CYCLOBENZAPRINE HCL 10 MG PO TABS: 10.0000 mg | ORAL_TABLET | Freq: Three times a day (TID) | ORAL | Status: DC | PRN

## 2013-03-23 MED ORDER — DOXAZOSIN MESYLATE 8 MG PO TABS
8.0000 mg | ORAL_TABLET | Freq: Every day | ORAL | Status: DC
  Administered 2013-03-23: 8 mg via ORAL
  Filled 2013-03-23 (×2): qty 1

## 2013-03-23 MED ORDER — LORAZEPAM 2 MG/ML IJ SOLN
1.0000 mg | Freq: Once | INTRAMUSCULAR | Status: AC
  Administered 2013-03-23: 1 mg via INTRAVENOUS

## 2013-03-23 MED ORDER — AMLODIPINE BESYLATE 10 MG PO TABS
10.0000 mg | ORAL_TABLET | Freq: Every day | ORAL | Status: DC
  Administered 2013-03-23 – 2013-03-24 (×2): 10 mg via ORAL
  Filled 2013-03-23 (×2): qty 1

## 2013-03-23 MED ORDER — LABETALOL HCL 200 MG PO TABS
200.0000 mg | ORAL_TABLET | Freq: Two times a day (BID) | ORAL | Status: DC
  Administered 2013-03-23 – 2013-03-24 (×3): 200 mg via ORAL
  Filled 2013-03-23 (×4): qty 1

## 2013-03-23 MED ORDER — HYDRALAZINE HCL 20 MG/ML IJ SOLN
10.0000 mg | Freq: Four times a day (QID) | INTRAMUSCULAR | Status: DC | PRN
  Administered 2013-03-23: 10 mg via INTRAVENOUS
  Filled 2013-03-23 (×3): qty 1

## 2013-03-23 MED ORDER — PREDNISONE 20 MG PO TABS: 40.0000 mg | ORAL_TABLET | Freq: Every day | ORAL | Status: DC

## 2013-03-23 MED ORDER — ONDANSETRON HCL 4 MG PO TABS: 4.0000 mg | ORAL_TABLET | Freq: Four times a day (QID) | ORAL | Status: DC | PRN

## 2013-03-23 MED ORDER — SODIUM CHLORIDE 0.9 % IJ SOLN
3.0000 mL | Freq: Two times a day (BID) | INTRAMUSCULAR | Status: DC
  Administered 2013-03-23 (×2): 3 mL via INTRAVENOUS

## 2013-03-23 MED ORDER — HYDROCODONE-ACETAMINOPHEN 5-325 MG PO TABS
1.0000 | ORAL_TABLET | Freq: Four times a day (QID) | ORAL | Status: DC | PRN
Start: 2013-03-23 — End: 2013-03-24

## 2013-03-23 MED ORDER — CLONIDINE HCL 0.1 MG PO TABS
0.1000 mg | ORAL_TABLET | Freq: Once | ORAL | Status: AC
  Administered 2013-03-23: 0.1 mg via ORAL
  Filled 2013-03-23: qty 1

## 2013-03-23 MED ORDER — HYDRALAZINE HCL 20 MG/ML IJ SOLN
10.0000 mg | Freq: Once | INTRAMUSCULAR | Status: AC
  Administered 2013-03-23: 10 mg via INTRAVENOUS

## 2013-03-23 MED ORDER — LORAZEPAM 2 MG/ML IJ SOLN
INTRAMUSCULAR | Status: AC
  Administered 2013-03-23: 1 mg via INTRAVENOUS
  Filled 2013-03-23: qty 1

## 2013-03-23 NOTE — ED Provider Notes (Signed)
Medical screening examination/treatment/procedure(s) were performed by resident physician or non-physician practitioner and as supervising physician I was immediately available for consultation/collaboration.   Corey Lamb,Corey Lamb DOUGLAS MD.   Marx D Rashawn Rolon, MD 03/23/13 1759 

## 2013-03-23 NOTE — H&P (Signed)
PCP:   COLADONATO,JOSEPH A, MD   Chief Complaint:  weakness  HPI: 63 yo male esrd on dialyis last 10 years, htn, comes in after missing several dialysis sessions with generalized weakness and sob.  No fevers.  No swelling.  No cough.  No n/v/d.  He does not remember his last dialysis, he hasnt gone because he says "he just doesn't have time".  No cp.  In dialysis unit now get dialysis.  Denies any n/t or weakness anywhere.  Review of Systems:  Positive and negative as per HPI otherwise all other systems are negative  Past Medical History: Past Medical History  Diagnosis Date  . Renal disorder   . Hypertension   . GERD (gastroesophageal reflux disease)   . Dialysis patient     Tu/Th/Sat   Past Surgical History  Procedure Laterality Date  . Av fistula placement      left arm    Medications: Prior to Admission medications   Medication Sig Start Date End Date Taking? Authorizing Provider  amLODipine (NORVASC) 10 MG tablet Take 10 mg by mouth daily.      Historical Provider, MD  calcium acetate, Phos Binder, (PHOSLYRA) 667 MG/5ML SOLN Take by mouth 3 (three) times daily with meals.      Historical Provider, MD  cyclobenzaprine (FLEXERIL) 10 MG tablet Take 1 tablet (10 mg total) by mouth 3 (three) times daily as needed for muscle spasms (and or back/leg pain). 03/11/13   Roma Kayser Schorr, NP  doxazosin (CARDURA) 8 MG tablet Take 8 mg by mouth at bedtime.    Historical Provider, MD  HYDROcodone-acetaminophen (NORCO/VICODIN) 5-325 MG per tablet Take 1-2 tablets by mouth every 6 (six) hours as needed for moderate pain. 01/26/13   Ardis Rowan, PA  labetalol (NORMODYNE) 100 MG tablet Take 200 mg by mouth 2 (two) times daily.     Historical Provider, MD  levofloxacin (LEVAQUIN) 500 MG tablet One tablet every  Day 03/09/13   Elson Areas, PA-C  multivitamin (RENA-VIT) TABS tablet Take 1 tablet by mouth daily.      Historical Provider, MD  omeprazole (PRILOSEC) 40 MG capsule Take  40 mg by mouth daily.    Historical Provider, MD  ondansetron (ZOFRAN) 4 MG tablet Take 1 tablet (4 mg total) by mouth every 6 (six) hours. 01/04/12   Fayrene Helper, PA-C  oxyCODONE-acetaminophen (PERCOCET/ROXICET) 5-325 MG per tablet Take 1 tablet by mouth every 4 (four) hours as needed for severe pain. 03/11/13   Roma Kayser Schorr, NP  predniSONE (DELTASONE) 20 MG tablet Take 2 tablets (40 mg total) by mouth daily with breakfast. For 5 days 03/11/13   Leanne Chang, NP    Allergies:  No Known Allergies  Social History:  reports that he has been smoking Cigarettes.  He has been smoking about 0.00 packs per day. He does not have any smokeless tobacco history on file. He reports that he does not drink alcohol or use illicit drugs.  Family History: History reviewed. No pertinent family history.  Physical Exam: Filed Vitals:   03/22/13 2329  BP: 211/113  Pulse: 66  Temp: 98.1 F (36.7 C)  TempSrc: Oral  Height: 5\' 9"  (1.753 m)  SpO2: 95%   General appearance: alert, cooperative and no distress Head: Normocephalic, without obvious abnormality, atraumatic Eyes: negative Nose: Nares normal. Septum midline. Mucosa normal. No drainage or sinus tenderness. Neck: no JVD and supple, symmetrical, trachea midline Lungs: clear to auscultation bilaterally Heart: regular rate and rhythm,  S1, S2 normal, no murmur, click, rub or gallop Abdomen: soft, non-tender; bowel sounds normal; no masses,  no organomegaly Extremities: extremities normal, atraumatic, no cyanosis or edema Pulses: 2+ and symmetric Skin: Skin color, texture, turgor normal. No rashes or lesions Neurologic: Grossly normal    Labs on Admission:   Recent Labs  03/22/13 2356  NA 138  K >7.7*  CL 93*  CO2 23  GLUCOSE 78  BUN 112*  CREATININE 17.09*  CALCIUM 8.7    Recent Labs  03/22/13 2356  AST 18  ALT 21  ALKPHOS 59  BILITOT 0.5  PROT 6.9  ALBUMIN 3.4*    Recent Labs  03/22/13 2356  WBC 8.3   NEUTROABS 6.1  HGB 10.6*  HCT 30.9*  MCV 84.2  PLT 137*    Recent Labs  03/22/13 2356  TROPONINI <0.30    Radiological Exams on Admission: Dg Chest Port 1 View  03/23/2013   CLINICAL DATA:  Weakness and lethargic  EXAM: PORTABLE CHEST - 1 VIEW  COMPARISON:  March 09, 2013  FINDINGS: The heart size and mediastinal contours are stable. The heart size is enlarged. The aorta is tortuous. There is pulmonary edema. There is a small left pleural effusion. The visualized skeletal structures are stable.  IMPRESSION: Congestive heart failure.  Small left pleural effusion.   Electronically Signed   By: Sherian ReinWei-Chen  Lin M.D.   On: 03/23/2013 00:26    Assessment/Plan  63 yo male with encephalopathy from uremia seems to be improving due to noncompliance with dialysis  Principal Problem:   Encephalopathy, metabolic-  Emergent dialysis tonight.  No focal neuro changes.  Obtain freq neuro cks overnight.  Consider further neuro w/u if he does not return to baseline with dialysis, but seems to be improving already.  Active Problems:   HYPERTENSION missed his bp meds tonight, will give them now   ESRD on dialysis  As above   Dialysis patient, noncompliant   Fluid overload  Stressed the importance of not missing his dialysis, needs to be reiterated again prior to discharge.  Nephrology consulted already.   Corey Lamb A 03/23/2013, 2:07 AM

## 2013-03-23 NOTE — Progress Notes (Signed)
Patient admitted this morning for uremic and hypertensive encephalopathy, he is a ESRD patient who frequently misses dialysis. Came in the ER after missing her the dialysis session with chief complaints of weakness and some confusion, his blood pressure was high, he was found to have uremic and hypertensive encephalopathy due to hypertensive urgency and missed dialysis. He was started on blood pressure medications, underwent dialysis early this afternoon and is now feeling much better. He is been counseled to be compliant with his dialysis sessions. If he remains stable will be discharged in the morning.

## 2013-03-23 NOTE — Progress Notes (Signed)
Pt admitted to unit transferred from hemo. Pt comes from Home Pt oriented to unit and safety measures.  Pt had Bp of 208/96 P 77. Dr. Thedore MinsSingh made aware and order received to give prn BP med.  Attempted to recheck patients BP and give medication. Pt became very agitated. Stated he did not want his blood pressure taken. Explained to that it was too high and the risks if it stays high. Pt refusing medication and stated he didn't ask to be cared for.   Md notified.

## 2013-03-23 NOTE — Consult Note (Signed)
[facility] Renal Consultation Note    Indication for Consultation:  Management of ESRD/hemodialysis; anemia, hypertension/volume and secondary hyperparathyroidism  HPI: Corey Lamb is a 63 y.o. male ESRD patient (TTS HD) who was transported to the ED via EMS late last night after a family member found him to to be having difficulty standing/walking secondary to weakness and leg pain. His last outpatient hemodialysis session was 03/17/13 and records are notable for frequent sign-offs and missed treatments of late. Upon arrival, he was found to have life-threatening hyperkalemia of 7.7, low blood glucose and systolic blood pressures in the 200's secondary to missing blood pressure medications.  Past Medical History  Diagnosis Date  . Renal disorder   . Hypertension   . GERD (gastroesophageal reflux disease)   . Dialysis patient     Tu/Th/Sat  . Hyperkalemia    Past Surgical History  Procedure Laterality Date  . Av fistula placement      left arm   History reviewed. No pertinent family history. Social History:  reports that he has been smoking Cigarettes.  He has a 12.5 pack-year smoking history. He has never used smokeless tobacco. He reports that he does not drink alcohol or use illicit drugs. No Known Allergies Prior to Admission medications   Medication Sig Start Date End Date Taking? Authorizing Provider  amLODipine (NORVASC) 10 MG tablet Take 10 mg by mouth daily.      Historical Provider, MD  calcium acetate, Phos Binder, (PHOSLYRA) 667 MG/5ML SOLN Take by mouth 3 (three) times daily with meals.      Historical Provider, MD  cyclobenzaprine (FLEXERIL) 10 MG tablet Take 1 tablet (10 mg total) by mouth 3 (three) times daily as needed for muscle spasms (and or back/leg pain). 03/11/13   Roma Kayser Schorr, NP  doxazosin (CARDURA) 8 MG tablet Take 8 mg by mouth at bedtime.    Historical Provider, MD  HYDROcodone-acetaminophen (NORCO/VICODIN) 5-325 MG per tablet Take 1-2  tablets by mouth every 6 (six) hours as needed for moderate pain. 01/26/13   Ardis Rowan, PA  labetalol (NORMODYNE) 100 MG tablet Take 200 mg by mouth 2 (two) times daily.     Historical Provider, MD  levofloxacin (LEVAQUIN) 500 MG tablet One tablet every  Day 03/09/13   Elson Areas, PA-C  multivitamin (RENA-VIT) TABS tablet Take 1 tablet by mouth daily.      Historical Provider, MD  omeprazole (PRILOSEC) 40 MG capsule Take 40 mg by mouth daily.    Historical Provider, MD  ondansetron (ZOFRAN) 4 MG tablet Take 1 tablet (4 mg total) by mouth every 6 (six) hours. 01/04/12   Fayrene Helper, PA-C  oxyCODONE-acetaminophen (PERCOCET/ROXICET) 5-325 MG per tablet Take 1 tablet by mouth every 4 (four) hours as needed for severe pain. 03/11/13   Roma Kayser Schorr, NP  predniSONE (DELTASONE) 20 MG tablet Take 2 tablets (40 mg total) by mouth daily with breakfast. For 5 days 03/11/13   Leanne Chang, NP   Current Facility-Administered Medications  Medication Dose Route Frequency Provider Last Rate Last Dose  . 0.9 %  sodium chloride infusion  250 mL Intravenous PRN Haydee Monica, MD      . amLODipine (NORVASC) tablet 10 mg  10 mg Oral Daily Haydee Monica, MD      . cyclobenzaprine (FLEXERIL) tablet 10 mg  10 mg Oral TID PRN Haydee Monica, MD      . doxazosin (CARDURA) tablet 8 mg  8 mg Oral  QHS Haydee Monica, MD      . hydrALAZINE (APRESOLINE) injection 10 mg  10 mg Intravenous Q6H PRN Leroy Sea, MD   10 mg at 03/23/13 0835  . HYDROcodone-acetaminophen (NORCO/VICODIN) 5-325 MG per tablet 1-2 tablet  1-2 tablet Oral Q6H PRN Haydee Monica, MD      . labetalol (NORMODYNE) tablet 200 mg  200 mg Oral BID Haydee Monica, MD      . multivitamin (RENA-VIT) tablet 1 tablet  1 tablet Oral QHS Haydee Monica, MD      . ondansetron [facility]) tablet 4 mg  4 mg Oral Q6H PRN Haydee Monica, MD      . oxyCODONE-acetaminophen (PERCOCET/ROXICET) 5-325 MG per tablet 1 tablet  1 tablet Oral Q4H PRN Haydee Monica, MD      . pantoprazole (PROTONIX) EC tablet 40 mg  40 mg Oral Daily Rachal A Onalee Hua, MD      . sodium chloride 0.9 % injection 3 mL  3 mL Intravenous Q12H Haydee Monica, MD      . sodium chloride 0.9 % injection 3 mL  3 mL Intravenous Q12H Haydee Monica, MD      . sodium chloride 0.9 % injection 3 mL  3 mL Intravenous PRN Haydee Monica, MD       Labs: Basic Metabolic Panel:  Recent Labs Lab 03/22/13 2356 03/23/13 0145 03/23/13 0736  NA 138 140 140  K >7.7* >7.7* 4.7  CL 93* 95* 93*  CO2 23 23 27   GLUCOSE 78 83 82  BUN 112* 113* 37*  CREATININE 17.09* 17.53* 8.28*  CALCIUM 8.7 8.8 9.0  PHOS  --  7.4*  --    Liver Function Tests:  Recent Labs Lab 03/22/13 2356 03/23/13 0145  AST 18  --   ALT 21  --   ALKPHOS 59  --   BILITOT 0.5  --   PROT 6.9  --   ALBUMIN 3.4* 3.2*   No results found for this basename: LIPASE, AMYLASE,  in the last 168 hours No results found for this basename: AMMONIA,  in the last 168 hours CBC:  Recent Labs Lab 03/22/13 2356 03/23/13 0736  WBC 8.3 5.9  NEUTROABS 6.1  --   HGB 10.6* 10.8*  HCT 30.9* 32.1*  MCV 84.2 83.4  PLT 137* 143*   Cardiac Enzymes:  Recent Labs Lab 03/22/13 2356  TROPONINI <0.30   CBG:  Recent Labs Lab 03/22/13 2351 03/23/13 0101 03/23/13 0304 03/23/13 0502  GLUCAP 67* 138* 71 105*   INR: @labcntip (inr:3)@ Iron Studies: No results found for this basename: IRON, TIBC, TRANSFERRIN, FERRITIN,  in the last 72 hours Studies/Results: Dg Chest Port 1 View  03/23/2013   CLINICAL DATA:  Weakness and lethargic  EXAM: PORTABLE CHEST - 1 VIEW  COMPARISON:  March 09, 2013  FINDINGS: The heart size and mediastinal contours are stable. The heart size is enlarged. The aorta is tortuous. There is pulmonary edema. There is a small left pleural effusion. The visualized skeletal structures are stable.  IMPRESSION: Congestive heart failure.  Small left pleural effusion.   Electronically Signed   By: Sherian Rein M.D.   On: 03/23/2013 00:26    ROS:   Physical Exam: Filed Vitals:   03/23/13 0557 03/23/13 0624 03/23/13 0810 03/23/13 0816  BP: 197/115 208/96 209/105 213/99  Pulse: 80 77 80   Temp: 98.6 F (37 C) 98.7 F (37.1 C) 99.7 F (37.6 C)  TempSrc: Oral Oral Oral   Resp: 20 18 20    Height:      Weight:  72.077 kg (158 lb 14.4 oz)    SpO2: 97% 96% 95%      General: Well developed, well nourished, in no acute distress. Head: Normocephalic, atraumatic, sclera non-icteric, mucus membranes are moist Neck: Supple. JVD not elevated. Lungs: Clear bilaterally to auscultation without wheezes, rales, or rhonchi. Breathing is unlabored. Heart: RRR with S1 S2. No murmurs, rubs, or gallops appreciated. Abdomen: Soft, non-tender, non-distended with normoactive bowel sounds. No rebound/guarding. No obvious abdominal masses. M-S:  Strength and tone appear normal for age. Lower extremities:without edema or ischemic changes, no open wounds  Neuro: Alert and oriented X 3. Moves all extremities spontaneously. Psych:  Responds to questions appropriately with a normal affect. Dialysis Access:  Dialysis Orders[facility]  [facility] . EDW 76kg Bath 2K/2.25Ca  Time 3:30 Heparin 7800. Access L AVF BFR 450 DFR 800    Hectorol 2 mcg IV/HD Epogen 1200   Units IV/HD  Venofer 0  Recent labs: Hgb 10.2, Tsat 49%, P 3.8, PTH 152 Alb 4.1  Assessment/Plan: 1. Hyperkalemia - K+ 7.7 on admit. Resolved with emergent HD this a.m, now 4.7 2. ESRD -  TTS, next HD tomorrow to stay on schedule 3. Hypertension/volume  - SBPs still > 200. Per notes, is refusing BP checks and medications. (Amlodipine 10 QD, labetalol 200 BID, Cardura 8 mg QHS). Pulm edema + small L effusion on CXR.  Under EDW here by ~4kg. Needs lower EDW op.  4. Anemia  - Hgb 10.8 on low dose Epo outpatient. Will start Aranesp 25 for maintenance. No IV Fe as Tsat is @ goal 5. Metabolic bone disease -  Corr Ca ~9.4. Phos up here but usually controlled  op. Last PTH in range. Continue Vit D, binders 6. Nutrition - Renal diet, multivitamin 7. Medical non-compliance - Needs reinforcement of goals but has not been receptive in the past. ? If psych eval might be beneficial  Claud KelpKaren Warren, P.A. [facility] 03/23/2013    11:20 AM  I have seen and examined patient, discussed with PA and agree with assessment and plan as outlined above.   ESRD patient missed two HD sessions and presented with muscle weakness due to hyperkalemia. Is now corrected after dialysis.  He was volume overloaded on admission with uncontrolled HTN and a lot of this is probably due to lean body wt loss.  Plan HD in am if still here.  Dietician consulted.  Will follow.   Vinson Moselleob Jabree Pernice MD pager [phone]    cell [phone] 03/23/2013, 2:19 PM

## 2013-03-23 NOTE — Progress Notes (Signed)
[facility] 63 [facility] Last HD was 2/5. Admitted with life threatening hyperkalemia.  Plan for emergent dialysis. Outpt TTS [facility] 3 1/2 450 800 left AVF UF profile 4 EDW 76 kg EPO 1200 qHD  hectorol 2 mcg qHD.  Standard heparin.    For purposes of his emergent treatment he will undergo 4 hours, 0K bath for 30 min then 1K with stat renal pre HD to determine most current K (>7.7 in the ED). He has missed 2 outpt treatments in a row, and his potassium on 03/10/13 at the outpt unit was 7.6 so needs diet education and compliance counseling.

## 2013-03-23 NOTE — Progress Notes (Signed)
Notified Dr. Thedore MinsSingh about patient B/P of 208/99.  Order received for one time dose of 10 mg Hydralazine IV.  Peri MarisAndrew Joellyn Grandt, MBA, BS, RN

## 2013-03-23 NOTE — ED Notes (Signed)
Patient was transferred from ED (room B16) to hemodialysis after giving report to Al at 0130.

## 2013-03-23 NOTE — Plan of Care (Signed)
Problem: Self-Monitoring Deficit (NB-1.4) Goal: Nutrition education Formal process to instruct or train a patient/client in a skill or to impart knowledge to help patients/clients voluntarily manage or modify food choices and eating behavior to maintain or improve health. Outcome: Completed/Met Date Met:  03/23/13  Nutrition Education Note  RD consulted for Renal Education,specifically regarding Low Potassium. Provided Renal Food Pyramid to patient. This RD attempted to discuss education with patient, however he declined, stating that he has eaten the same way for 10 years and will NOT change his diet. RD attempted to ask questions about specific foods high in potassium, however pt would not contribute to conversation, repeating that he knows that his ONLY issue is not going to HD and that as long as he goes to HD he can continue to eat the way that he does. Encouraged compliance of diet and attending scheduled HD treatments.  Expect poor compliance.  Body mass index is 23.45 kg/(m^2). Pt meets criteria for normal weight based on current BMI.  Current diet order is Renal. Labs and medications reviewed. No further nutrition interventions warranted at this time. RD contact information provided. If additional nutrition issues arise, please re-consult RD.  Inda Coke MS, RD, LDN Inpatient Registered Dietitian Pager: [phone] After-hours pager: [phone]

## 2013-03-23 NOTE — Progress Notes (Signed)
UR completed. Patient changed to inpatient- required emergent dialysis.

## 2013-03-24 DIAGNOSIS — Z9115 Patient's noncompliance with renal dialysis: Secondary | ICD-10-CM

## 2013-03-24 DIAGNOSIS — R0602 Shortness of breath: Secondary | ICD-10-CM

## 2013-03-24 DIAGNOSIS — Z91158 Patient's noncompliance with renal dialysis for other reason: Secondary | ICD-10-CM

## 2013-03-24 DIAGNOSIS — N186 End stage renal disease: Secondary | ICD-10-CM

## 2013-03-24 LAB — RENAL FUNCTION PANEL
Albumin: 3.1 g/dL — ABNORMAL LOW (ref 3.5–5.2)
BUN: 57 mg/dL — ABNORMAL HIGH (ref 6–23)
CO2: 26 mEq/L (ref 19–32)
CREATININE: 11.65 mg/dL — AB (ref 0.50–1.35)
Calcium: 8.7 mg/dL (ref 8.4–10.5)
Chloride: 92 mEq/L — ABNORMAL LOW (ref 96–112)
GFR calc Af Amer: 5 mL/min — ABNORMAL LOW (ref >90)
GFR calc non Af Amer: 4 mL/min — ABNORMAL LOW (ref >90)
Glucose, Bld: 94 mg/dL (ref 70–99)
PHOSPHORUS: 7.4 mg/dL — AB (ref 2.3–4.6)
Potassium: 5.5 mEq/L — ABNORMAL HIGH (ref 3.7–5.3)
SODIUM: 136 meq/L — AB (ref 137–147)

## 2013-03-24 LAB — CBC
HEMATOCRIT: 48.9 % (ref 39.0–52.0)
HEMOGLOBIN: 16.9 g/dL (ref 13.0–17.0)
MCH: 29 pg (ref 26.0–34.0)
MCHC: 34.6 g/dL (ref 30.0–36.0)
MCV: 83.9 fL (ref 78.0–100.0)
Platelets: 72 10*3/uL — ABNORMAL LOW (ref 150–400)
RBC: 5.83 MIL/uL — ABNORMAL HIGH (ref 4.22–5.81)
RDW: 17.8 % — ABNORMAL HIGH (ref 11.5–15.5)
WBC: 3.1 10*3/uL — ABNORMAL LOW (ref 4.0–10.5)

## 2013-03-24 MED ORDER — DARBEPOETIN ALFA-POLYSORBATE 25 MCG/0.42ML IJ SOLN
INTRAMUSCULAR | Status: AC
  Administered 2013-03-24: 25 ug
  Filled 2013-03-24: qty 0.42

## 2013-03-24 MED ORDER — HEPARIN SODIUM (PORCINE) 1000 UNIT/ML IJ SOLN
7000.0000 [IU] | Freq: Once | INTRAMUSCULAR | Status: AC
  Administered 2013-03-24: 7000 [IU] via INTRAVENOUS

## 2013-03-24 NOTE — Progress Notes (Signed)
Patient discharge teaching given, including activity, diet, follow-up appoints, and medications. Patient verbalized understanding of all discharge instructions. IV access was d/c'd. Vitals are stable. Skin is intact except as charted in most recent assessments. Pt to be escorted out by NT, to be driven home by family.  Remus Hagedorn, MBA, BS, RN 

## 2013-03-24 NOTE — Discharge Summary (Signed)
Corey Lamb, is a 63 y.o. male  DOB 1950/12/08  MRN [id number].  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 Renal disorder   . Hypertension   . GERD (gastroesophageal reflux disease)   . Dialysis patient     Tu/Th/Sat  . Hyperkalemia     Past Surgical History  Procedure Laterality Date  . Av fistula placement      left arm     Discharge Condition: stable   Follow UP  Follow-up Information   Follow up with COLADONATO,JOSEPH A, MD. Schedule an appointment as soon as possible for a visit in 2 days.   Specialty:  Nephrology   Contact information:   [address] [facility] [place] [address] [phone]         Discharge Instructions  and  Discharge Medications      Discharge Orders   Future Orders Complete By Expires   Discharge instructions  As directed    Comments:     Follow with  Primary MD COLADONATO,JOSEPH A, MD in 7 days   Get CBC, CMP, checked 7 days by Primary MD and again as instructed by your Primary MD.    Activity: As tolerated with Full fall precautions use walker/cane & assistance as needed   Disposition Home     Diet: Renal-low salt diet, 1.2 L fluid restriction on a daily basis  Check your Weight same time everyday, if you gain over 2 pounds, or you develop in leg swelling, experience more shortness of breath or chest pain, call your Primary MD immediately. Follow Cardiac Low Salt Diet and 1.2 lit/day fluid restriction.   On your next visit with her primary care physician please Get Medicines reviewed and adjusted.  Please request your Prim.MD to go over all Hospital Tests and Procedure/Radiological results at the follow up, please get all Hospital records sent to your Prim MD by signing hospital release before you go home.   If you experience worsening of your admission symptoms, develop shortness of breath, life threatening emergency, suicidal or homicidal thoughts you must seek medical attention immediately by calling 911 or calling your MD immediately  if symptoms less severe.  You Must read complete instructions/literature along with all  the possible adverse reactions/side effects for all the Medicines you take and that have been prescribed to you. Take any new Medicines after you have completely understood and accpet all the possible adverse reactions/side effects.   Do not drive and provide baby sitting services if your were admitted for syncope or siezures until you have seen by Primary MD or a Neurologist and advised to do so again.  Do not drive when taking Pain medications.    Do not take more than prescribed Pain, Sleep and Anxiety Medications  Special Instructions: If you have smoked or chewed Tobacco  in the last 2 yrs please stop smoking, stop any regular Alcohol  and or any Recreational drug use.  Wear Seat belts while  driving.   Please note  You were cared for by a hospitalist during your hospital stay. If you have any questions about your discharge medications or the care you received while you were in the hospital after you are discharged, you can call the unit and asked to speak with the hospitalist on call if the hospitalist that took care of you is not available. Once you are discharged, your primary care physician will handle any further medical issues. Please note that NO REFILLS for any discharge medications will be authorized once you are discharged, as it is imperative that you return to your primary care physician (or establish a relationship with a primary care physician if you do not have one) for your aftercare needs so that they can reassess your need for medications and monitor your lab values.   Increase activity slowly  As directed        Medication List         acetaminophen 500 MG tablet  Commonly known as:  TYLENOL  Take 1,000 mg by mouth every 4 (four) hours as needed (pain).     amLODipine 10 MG tablet  Commonly known as:  NORVASC  Take 10 mg by mouth 2 (two) times daily.     calcium acetate 667 MG capsule  Commonly known as:  PHOSLO  Take 2,001 mg by mouth 3 (three) times daily with meals.     cyclobenzaprine 10 MG tablet  Commonly known as:  FLEXERIL  Take 1 tablet (10 mg total) by mouth 3 (three) times daily as needed for muscle spasms (and or back/leg pain).     doxazosin 8 MG tablet  Commonly known as:  CARDURA  Take 8 mg by mouth at bedtime.     labetalol 100 MG tablet  Commonly known as:  NORMODYNE  Take 200 mg by mouth 2 (two) times daily.     lisinopril 40 MG tablet  Commonly known as:  PRINIVIL,ZESTRIL  Take 40 mg by mouth 2 (two) times daily.     NAPROXEN PO  Take 1 tablet by mouth every 4 (four) hours as needed (pain).     omeprazole 40 MG capsule  Commonly known as:  PRILOSEC  Take 40 mg by mouth daily.     OVER THE COUNTER MEDICATION  Apply 1  application topically every 2 (two) hours as needed (muscle cramps). "Mioflex" cream     oxyCODONE-acetaminophen 5-325 MG per tablet  Commonly known as:  PERCOCET/ROXICET  Take 1 tablet by mouth every 4 (four) hours as needed for severe pain.          Diet and Activity recommendation: See Discharge Instructions above   Consults obtained - Renal   Major procedures and Radiology Reports - PLEASE review  detailed and final reports for all details, in brief -       Dg Chest 2 View  03/09/2013   CLINICAL DATA:  Generalized body aches.  EXAM: CHEST  2 VIEW  COMPARISON:  01/04/2012  FINDINGS: Lungs are adequately inflated with stable chronic changes over the left base. There is mild right base opacification which may be due to atelectasis or infection. There is a suggestion of a small amount of right pleural fluid. There is mild stable cardiomegaly. There is a mild to moderate T8 compression fracture unchanged.  IMPRESSION: Right basilar opacification which may be due to atelectasis versus infection. Possible small amount right pleural fluid.  Stable chronic changes left lung base.  Stable cardiomegaly.  Stable T8 compression fracture.   Electronically Signed   By: Elberta Fortisaniel  Boyle M.D.   On: 03/09/2013 17:43   Dg Chest Port 1 View  03/23/2013   CLINICAL DATA:  Weakness and lethargic  EXAM: PORTABLE CHEST - 1 VIEW  COMPARISON:  March 09, 2013  FINDINGS: The heart size and mediastinal contours are stable. The heart size is enlarged. The aorta is tortuous. There is pulmonary edema. There is a small left pleural effusion. The visualized skeletal structures are stable.  IMPRESSION: Congestive heart failure.  Small left pleural effusion.   Electronically Signed   By: Sherian ReinWei-Chen  Lin M.D.   On: 03/23/2013 00:26    Micro Results      Recent Results (from the past 240 hour(s))  MRSA PCR SCREENING     Status: None   Collection Time    03/23/13  5:18 PM      Result Value Ref Range Status   MRSA by  PCR NEGATIVE  NEGATIVE Final   Comment:            The GeneXpert MRSA Assay (FDA     approved for NASAL specimens     only), is one component of a     comprehensive MRSA colonization     surveillance program. It is not     intended to diagnose MRSA     infection nor to guide or     monitor treatment for     MRSA infections.     History of present illness and  Hospital Course:     Kindly see H&P for history of present illness and admission details, please review complete Labs, Consult reports and Test reports for all details in brief Corey Lamb, is a 63 y.o. male, patient with history of  ESRD on dialysis, hypertension, GERD who has long-standing history of noncompliance with medications and dialysis regimen came to the ER after missing his dialysis routine with chief complaints of generalized weakness and confusion.   In the ER his workup was consistent with hypertensive urgency, uremia and toxic encephalopathy due to both. He was treated with dialysis, blood pressure medications were resumed, now his blood pressure is stable and patient is back to his baseline, he has no subjective complaints and he has been extensively counseled to be compliant with his blood pressure regimen and dialysis routine. He will follow with his PCP who is also his nephrologist in the next 1-2 days.     Today   Subjective:   Corey Lamb today has no headache,no chest abdominal pain,no new weakness tingling or numbness, feels much better wants to go home today.    Objective:   Blood pressure 155/94, pulse 88, temperature 98.3 F (36.8 C), temperature source Oral, resp. rate 16, height 5'  9" (1.753 m), weight 72.9 kg (160 lb 11.5 oz), SpO2 98.00%.   Intake/Output Summary (Last 24 hours) at 03/24/13 1053 Last data filed at 03/23/13 1850  Gross per 24 hour  Intake    360 ml  Output      0 ml  Net    360 ml    Exam Awake Alert, Oriented *3, No new F.N deficits, Normal affect [place].AT,PERRAL Supple  Neck,No JVD, No cervical lymphadenopathy appriciated.  Symmetrical Chest wall movement, Good air movement bilaterally, CTAB RRR,No Gallops,Rubs or new Murmurs, No Parasternal Heave +ve B.Sounds, Abd Soft, Non tender, No organomegaly appriciated, No rebound -guarding or rigidity. No Cyanosis, Clubbing or edema, No new Rash or bruise  Data Review   CBC w Diff: Lab Results  Component Value Date   WBC 3.1* 03/24/2013   HGB 16.9 03/24/2013   HCT 48.9 03/24/2013   PLT 72* 03/24/2013   LYMPHOPCT 12 03/22/2013   MONOPCT 13* 03/22/2013   EOSPCT 2 03/22/2013   BASOPCT 0 03/22/2013    CMP: Lab Results  Component Value Date   NA 136* 03/24/2013   K 5.5* 03/24/2013   CL 92* 03/24/2013   CO2 26 03/24/2013   BUN 57* 03/24/2013   CREATININE 11.65* 03/24/2013   PROT 6.9 03/22/2013   ALBUMIN 3.1* 03/24/2013   BILITOT 0.5 03/22/2013   ALKPHOS 59 03/22/2013   AST 18 03/22/2013   ALT 21 03/22/2013  .   Total Time in preparing paper work, data evaluation and todays exam - 35 minutes  Leroy Sea M.D on 03/24/2013 at 10:53 AM  [facility] Office  [phone]                                                                                                                                                                Norvil Martensen, is a 63 y.o. male  DOB 1950/04/21  MRN [id number].  Admission date:  03/22/2013  Admitting Physician  Haydee Monica, MD  Discharge Date:  03/24/2013   Primary MD  Irena Cords, MD  Recommendations for primary care physician for things to follow:   Follow blood pressure, medication and dialysis compliance closely.   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  Renal disorder   . Hypertension   . GERD (gastroesophageal reflux disease)   . Dialysis patient     Tu/Th/Sat  . Hyperkalemia     Past Surgical History  Procedure Laterality Date  . Av fistula placement      left arm     Discharge Condition: stable   Follow UP  Follow-up Information   Follow up with COLADONATO,JOSEPH A, MD. Schedule an appointment as soon as possible for a visit in 2 days.   Specialty:  Nephrology   Contact information:   [address] [facility] [place] [address] [phone]         Discharge Instructions  and  Discharge Medications      Discharge Orders   Future Orders Complete By Expires   Discharge instructions  As directed    Comments:     Follow with Primary MD COLADONATO,JOSEPH A, MD in 7 days   Get CBC, CMP, checked 7 days by Primary MD and again as instructed by your Primary MD.    Activity: As tolerated with Full fall precautions use walker/cane & assistance as needed   Disposition Home     Diet: Renal-low salt diet, 1.2 L fluid restriction on a daily basis  Check your Weight same time everyday, if you gain over 2 pounds, or you develop in leg swelling, experience more shortness of breath or chest pain, call your Primary MD immediately. Follow Cardiac Low Salt Diet and 1.2 lit/day fluid restriction.   On your next visit with her primary care physician please Get Medicines reviewed and adjusted.  Please request your Prim.MD to go over all Hospital Tests and Procedure/Radiological results at the follow up, please get all Hospital records sent  to your Prim MD by signing hospital release before you go home.   If you experience worsening of your admission symptoms, develop shortness of breath, life threatening emergency, suicidal or homicidal thoughts you must seek medical attention immediately by calling 911 or calling your MD immediately  if symptoms less severe.  You Must read complete instructions/literature along with all the possible adverse reactions/side effects for all the Medicines you take and that have been prescribed to you. Take any new Medicines after you have completely understood and accpet all the possible adverse reactions/side effects.   Do not drive and provide baby sitting services if your were admitted for syncope or siezures until you have seen by Primary MD or a Neurologist and advised to do so again.  Do not drive when taking Pain medications.    Do not take more than prescribed Pain, Sleep and Anxiety Medications  Special Instructions: If you have smoked or chewed Tobacco  in the last 2 yrs please stop smoking, stop any regular Alcohol  and or any Recreational drug use.  Wear Seat belts while driving.   Please note  You were cared for by a hospitalist during your hospital stay. If you have any questions about your discharge medications or the care you received while you were in the hospital after you are discharged, you can call the unit and asked to speak with the hospitalist on call if the hospitalist that took care of you is not available. Once you are discharged, your primary care physician will handle any further medical issues. Please note that NO REFILLS for any discharge medications will be authorized once you are discharged, as it is imperative that you return to your primary care physician (or establish a relationship with a primary  care physician if you do not have one) for your aftercare needs so that they can reassess your need for medications and monitor your lab values.   Increase activity  slowly  As directed        Medication List         acetaminophen 500 MG tablet  Commonly known as:  TYLENOL  Take 1,000 mg by mouth every 4 (four) hours as needed (pain).     amLODipine 10 MG tablet  Commonly known as:  NORVASC  Take 10 mg by mouth 2 (two) times daily.     calcium acetate 667 MG capsule  Commonly known as:  PHOSLO  Take 2,001 mg by mouth 3 (three) times daily with meals.     cyclobenzaprine 10 MG tablet  Commonly known as:  FLEXERIL  Take 1 tablet (10 mg total) by mouth 3 (three) times daily as needed for muscle spasms (and or back/leg pain).     doxazosin 8 MG tablet  Commonly known as:  CARDURA  Take 8 mg by mouth at bedtime.     labetalol 100 MG tablet  Commonly known as:  NORMODYNE  Take 200 mg by mouth 2 (two) times daily.     lisinopril 40 MG tablet  Commonly known as:  PRINIVIL,ZESTRIL  Take 40 mg by mouth 2 (two) times daily.     NAPROXEN PO  Take 1 tablet by mouth every 4 (four) hours as needed (pain).     omeprazole 40 MG capsule  Commonly known as:  PRILOSEC  Take 40 mg by mouth daily.     OVER THE COUNTER MEDICATION  Apply 1 application topically every 2 (two) hours as needed (muscle cramps). "Mioflex" cream     oxyCODONE-acetaminophen 5-325 MG per tablet  Commonly known as:  PERCOCET/ROXICET  Take 1 tablet by mouth every 4 (four) hours as needed for severe pain.          Diet and Activity recommendation: See Discharge Instructions above   Consults obtained - Renal   Major procedures and Radiology Reports - PLEASE review detailed and final reports for all details, in brief -         Dg Chest Port 1 View  03/23/2013   CLINICAL DATA:  Weakness and lethargic  EXAM: PORTABLE CHEST - 1 VIEW  COMPARISON:  March 09, 2013  FINDINGS: The heart size and mediastinal contours are stable. The heart size is enlarged. The aorta is tortuous. There is pulmonary edema. There is a small left pleural effusion. The visualized skeletal  structures are stable.  IMPRESSION: Congestive heart failure.  Small left pleural effusion.   Electronically Signed   By: Sherian Rein M.D.   On: 03/23/2013 00:26    Micro Results      Recent Results (from the past 240 hour(s))  MRSA PCR SCREENING     Status: None   Collection Time    03/23/13  5:18 PM      Result Value Ref Range Status   MRSA by PCR NEGATIVE  NEGATIVE Final   Comment:            The GeneXpert MRSA Assay (FDA     approved for NASAL specimens     only), is one component of a     comprehensive MRSA colonization     surveillance program. It is not     intended to diagnose MRSA     infection nor to guide or     monitor treatment  for     MRSA infections.     History of present illness and  Hospital Course:     Kindly see H&P for history of present illness and admission details, please review complete Labs, Consult reports and Test reports for all details in brief Corey Lamb, is a 63 y.o. male, patient with history of  ESRD on 3 weeks a day dialysis regimen, hypertension, GERD who is noncompliant with his medications and his dialysis regimen presented to the ER after missing his dialysis with chief complaints of generalized weakness and confusion.   In the ER he was found to be uremic , also in hypertensive crisis and had encephalopathy due to hypertensive crisis and uremia.   He was treated with dialysis, blood pressure indications were resumed with good effect, his mentation is now back to baseline and he feels good, he has been extensively counseled to be compliant with his home medications and dialysis routine. He will follow with his PCP who is also his nephrologist in the next 1-2 days.       Today   Subjective:   Corey Lamb today has no headache,no chest abdominal pain,no new weakness tingling or numbness, feels much better wants to go home today.    Objective:   Blood pressure 155/94, pulse 88, temperature 98.3 F (36.8 C), temperature source  Oral, resp. rate 16, height 5\' 9"  (1.753 m), weight 72.9 kg (160 lb 11.5 oz), SpO2 98.00%.   Intake/Output Summary (Last 24 hours) at 03/24/13 1053 Last data filed at 03/23/13 1850  Gross per 24 hour  Intake    360 ml  Output      0 ml  Net    360 ml    Exam Awake Alert, Oriented *3, No new F.N deficits, Normal affect [place].AT,PERRAL Supple Neck,No JVD, No cervical lymphadenopathy appriciated.  Symmetrical Chest wall movement, Good air movement bilaterally, CTAB RRR,No Gallops,Rubs or new Murmurs, No Parasternal Heave +ve B.Sounds, Abd Soft, Non tender, No organomegaly appriciated, No rebound -guarding or rigidity. No Cyanosis, Clubbing or edema, No new Rash or bruise  Data Review   CBC w Diff: Lab Results  Component Value Date   WBC 3.1* 03/24/2013   HGB 16.9 03/24/2013   HCT 48.9 03/24/2013   PLT 72* 03/24/2013   LYMPHOPCT 12 03/22/2013   MONOPCT 13* 03/22/2013   EOSPCT 2 03/22/2013   BASOPCT 0 03/22/2013    CMP: Lab Results  Component Value Date   NA 136* 03/24/2013   K 5.5* 03/24/2013   CL 92* 03/24/2013   CO2 26 03/24/2013   BUN 57* 03/24/2013   CREATININE 11.65* 03/24/2013   PROT 6.9 03/22/2013   ALBUMIN 3.1* 03/24/2013   BILITOT 0.5 03/22/2013   ALKPHOS 59 03/22/2013   AST 18 03/22/2013   ALT 21 03/22/2013  .   Total Time in preparing paper work, data evaluation and todays exam - 35 minutes  Leroy Sea M.D on 03/24/2013 at 10:53 AM  [facility] Office  [phone]

## 2013-03-24 NOTE — Discharge Instructions (Signed)
Follow with Primary MD COLADONATO,JOSEPH A, MD in 7 days   Get CBC, CMP, checked 7 days by Primary MD and again as instructed by your Primary MD.    Activity: As tolerated with Full fall precautions use walker/cane & assistance as needed   Disposition Home     Diet: Renal-low salt diet, 1.2 L fluid restriction on a daily basis  Check your Weight same time everyday, if you gain over 2 pounds, or you develop in leg swelling, experience more shortness of breath or chest pain, call your Primary MD immediately. Follow Cardiac Low Salt Diet and 1.2 lit/day fluid restriction.   On your next visit with her primary care physician please Get Medicines reviewed and adjusted.  Please request your Prim.MD to go over all Hospital Tests and Procedure/Radiological results at the follow up, please get all Hospital records sent to your Prim MD by signing hospital release before you go home.   If you experience worsening of your admission symptoms, develop shortness of breath, life threatening emergency, suicidal or homicidal thoughts you must seek medical attention immediately by calling 911 or calling your MD immediately  if symptoms less severe.  You Must read complete instructions/literature along with all the possible adverse reactions/side effects for all the Medicines you take and that have been prescribed to you. Take any new Medicines after you have completely understood and accpet all the possible adverse reactions/side effects.   Do not drive and provide baby sitting services if your were admitted for syncope or siezures until you have seen by Primary MD or a Neurologist and advised to do so again.  Do not drive when taking Pain medications.    Do not take more than prescribed Pain, Sleep and Anxiety Medications  Special Instructions: If you have smoked or chewed Tobacco  in the last 2 yrs please stop smoking, stop any regular Alcohol  and or any Recreational drug use.  Wear Seat belts  while driving.   Please note  You were cared for by a hospitalist during your hospital stay. If you have any questions about your discharge medications or the care you received while you were in the hospital after you are discharged, you can call the unit and asked to speak with the hospitalist on call if the hospitalist that took care of you is not available. Once you are discharged, your primary care physician will handle any further medical issues. Please note that NO REFILLS for any discharge medications will be authorized once you are discharged, as it is imperative that you return to your primary care physician (or establish a relationship with a primary care physician if you do not have one) for your aftercare needs so that they can reassess your need for medications and monitor your lab values.

## 2013-03-24 NOTE — Procedures (Signed)
I was present at this dialysis session, have reviewed the session itself and made  appropriate changes  Rob Missael Ferrari MD (pgr) [phone]    (c) [phone] 03/24/2013, 9:52 AM   

## 2013-03-24 NOTE — Progress Notes (Signed)
  [facility] Progress Note   Subjective: no sob or n/v  Exam  Blood pressure 157/102, pulse 79, temperature 98.3 F (36.8 C), temperature source Oral, resp. rate 18, height 5\' 9"  (1.753 m), weight 72.9 kg (160 lb 11.5 oz), SpO2 98.00%. Exam Alert, no distress No jvd Chest clear bilat RRR no MRG Abd soft, nt, nd Ext no edema today Neuro is nf,ox3 LUA avf patent  Dialysis: T[facility]  3.5h   76kg   Bath 2K/2.25Bath   Heparin 7800    LUA AVF  Hectorol 2    Epo 1200     Venofer none   Assessment: 1 Hyperkalemia, better 2 Vol overload- lean body wt loss 3 ESRD, was missing HD for several reasons 4 HTN- cont BP meds, vol reduction 5 Anemia, cont epo 6 2HPTH- no chg in Rx   Plan- OK for d/c home after HD today. Had long discussion about compliance and risks of missing time on HD    Vinson Moselleob Jose Alleyne MD  pager [phone]    cell [phone]  03/24/2013, 10:11 AM     Recent Labs Lab 03/22/13 2356 03/23/13 0145 03/23/13 0736 03/24/13 0708  NA 138 140 140 136*  K >7.7* >7.7* 4.7 5.5*  CL 93* 95* 93* 92*  CO2 23 23 27 26   GLUCOSE 78 83 82 94  BUN 112* 113* 37* 57*  CREATININE 17.09* 17.53* 8.28* 11.65*  CALCIUM 8.7 8.8 9.0 8.7  PHOS  --  7.4*  --  7.4*    Recent Labs Lab 03/22/13 2356 03/23/13 0145 03/24/13 0708  AST 18  --   --   ALT 21  --   --   ALKPHOS 59  --   --   BILITOT 0.5  --   --   PROT 6.9  --   --   ALBUMIN 3.4* 3.2* 3.1*    Recent Labs Lab 03/22/13 2356 03/23/13 0736 03/24/13 0708  WBC 8.3 5.9 3.1*  NEUTROABS 6.1  --   --   HGB 10.6* 10.8* 16.9  HCT 30.9* 32.1* 48.9  MCV 84.2 83.4 83.9  PLT 137* 143* 72*   . amLODipine  10 mg Oral Daily  . calcium acetate  1,334 mg Oral TID WC  . cloNIDine  0.2 mg Oral TID  . darbepoetin (ARANESP) injection - DIALYSIS  25 mcg Intravenous Q Thu-HD  . doxazosin  8 mg Oral QHS  . labetalol  200 mg Oral BID  . multivitamin  1 tablet Oral QHS  . pantoprazole  40 mg Oral Daily  . sodium  chloride  3 mL Intravenous Q12H  . sodium chloride  3 mL Intravenous Q12H     sodium chloride, cyclobenzaprine, hydrALAZINE, HYDROcodone-acetaminophen, ondansetron, oxyCODONE-acetaminophen, sodium chloride

## 2013-06-23 ENCOUNTER — Encounter (HOSPITAL_COMMUNITY): Payer: Self-pay | Admitting: Emergency Medicine

## 2013-06-23 ENCOUNTER — Emergency Department (HOSPITAL_COMMUNITY): Payer: Medicare Other

## 2013-06-23 ENCOUNTER — Emergency Department (HOSPITAL_COMMUNITY)
Admission: EM | Admit: 2013-06-23 | Discharge: 2013-06-23 | Disposition: A | Discharge status: Home / Self Care | Payer: Medicare Other | Attending: Emergency Medicine | Admitting: Emergency Medicine

## 2013-06-23 DIAGNOSIS — Z79899 Other long term (current) drug therapy: Secondary | ICD-10-CM | POA: Insufficient documentation

## 2013-06-23 DIAGNOSIS — F17200 Nicotine dependence, unspecified, uncomplicated: Secondary | ICD-10-CM | POA: Insufficient documentation

## 2013-06-23 DIAGNOSIS — M25562 Pain in left knee: Secondary | ICD-10-CM

## 2013-06-23 DIAGNOSIS — K219 Gastro-esophageal reflux disease without esophagitis: Secondary | ICD-10-CM | POA: Insufficient documentation

## 2013-06-23 DIAGNOSIS — I120 Hypertensive chronic kidney disease with stage 5 chronic kidney disease or end stage renal disease: Secondary | ICD-10-CM | POA: Insufficient documentation

## 2013-06-23 DIAGNOSIS — Z9181 History of falling: Secondary | ICD-10-CM | POA: Insufficient documentation

## 2013-06-23 DIAGNOSIS — Z992 Dependence on renal dialysis: Secondary | ICD-10-CM | POA: Insufficient documentation

## 2013-06-23 DIAGNOSIS — M79605 Pain in left leg: Secondary | ICD-10-CM

## 2013-06-23 DIAGNOSIS — Z87828 Personal history of other (healed) physical injury and trauma: Secondary | ICD-10-CM | POA: Insufficient documentation

## 2013-06-23 DIAGNOSIS — Z8639 Personal history of other endocrine, nutritional and metabolic disease: Secondary | ICD-10-CM | POA: Insufficient documentation

## 2013-06-23 DIAGNOSIS — Z862 Personal history of diseases of the blood and blood-forming organs and certain disorders involving the immune mechanism: Secondary | ICD-10-CM | POA: Insufficient documentation

## 2013-06-23 DIAGNOSIS — M25559 Pain in unspecified hip: Secondary | ICD-10-CM | POA: Insufficient documentation

## 2013-06-23 DIAGNOSIS — M25569 Pain in unspecified knee: Secondary | ICD-10-CM | POA: Insufficient documentation

## 2013-06-23 DIAGNOSIS — N186 End stage renal disease: Secondary | ICD-10-CM | POA: Insufficient documentation

## 2013-06-23 LAB — BASIC METABOLIC PANEL
BUN: 42 mg/dL — ABNORMAL HIGH (ref 6–23)
CO2: 27 mEq/L (ref 19–32)
Calcium: 9.5 mg/dL (ref 8.4–10.5)
Chloride: 95 mEq/L — ABNORMAL LOW (ref 96–112)
Creatinine, Ser: 9.31 mg/dL — ABNORMAL HIGH (ref 0.50–1.35)
GFR calc non Af Amer: 5 mL/min — ABNORMAL LOW (ref >90)
GFR, EST AFRICAN AMERICAN: 6 mL/min — AB (ref >90)
GLUCOSE: 104 mg/dL — AB (ref 70–99)
POTASSIUM: 4.9 meq/L (ref 3.7–5.3)
Sodium: 138 mEq/L (ref 137–147)

## 2013-06-23 LAB — PHOSPHORUS: Phosphorus: 6.2 mg/dL — ABNORMAL HIGH (ref 2.3–4.6)

## 2013-06-23 MED ORDER — OXYCODONE-ACETAMINOPHEN 5-325 MG PO TABS: 1.0000 | ORAL_TABLET | ORAL | Status: DC | PRN

## 2013-06-23 MED ORDER — OXYCODONE-ACETAMINOPHEN 5-325 MG PO TABS
1.0000 | ORAL_TABLET | Freq: Once | ORAL | Status: AC
  Administered 2013-06-23: 1 via ORAL
  Filled 2013-06-23: qty 1

## 2013-06-23 NOTE — ED Notes (Addendum)
Patient says he has been having left leg pain for about six months due to a "pulled muscle".  Patient says the pain has gotten worse so he came here to be evaluated.  The patient rates his pain 10/10.

## 2013-06-23 NOTE — ED Notes (Signed)
MD at bedside. 

## 2013-06-23 NOTE — Discharge Instructions (Signed)
Arthralgia  Your caregiver has diagnosed you as suffering from an arthralgia. Arthralgia means there is pain in a joint. This can come from many reasons including:  · Bruising the joint which causes soreness (inflammation) in the joint.  · Wear and tear on the joints which occur as we grow older (osteoarthritis).  · Overusing the joint.  · Various forms of arthritis.  · Infections of the joint.  Regardless of the cause of pain in your joint, most of these different pains respond to anti-inflammatory drugs and rest. The exception to this is when a joint is infected, and these cases are treated with antibiotics, if it is a bacterial infection.  HOME CARE INSTRUCTIONS   · Rest the injured area for as long as directed by your caregiver. Then slowly start using the joint as directed by your caregiver and as the pain allows. Crutches as directed may be useful if the ankles, knees or hips are involved. If the knee was splinted or casted, continue use and care as directed. If an stretchy or elastic wrapping bandage has been applied today, it should be removed and re-applied every 3 to 4 hours. It should not be applied tightly, but firmly enough to keep swelling down. Watch toes and feet for swelling, bluish discoloration, coldness, numbness or excessive pain. If any of these problems (symptoms) occur, remove the ace bandage and re-apply more loosely. If these symptoms persist, contact your caregiver or return to this location.  · For the first 24 hours, keep the injured extremity elevated on pillows while lying down.  · Apply ice for 15-20 minutes to the sore joint every couple hours while awake for the first half day. Then 03-04 times per day for the first 48 hours. Put the ice in a plastic bag and place a towel between the bag of ice and your skin.  · Wear any splinting, casting, elastic bandage applications, or slings as instructed.  · Only take over-the-counter or prescription medicines for pain, discomfort, or fever as  directed by your caregiver. Do not use aspirin immediately after the injury unless instructed by your physician. Aspirin can cause increased bleeding and bruising of the tissues.  · If you were given crutches, continue to use them as instructed and do not resume weight bearing on the sore joint until instructed.  Persistent pain and inability to use the sore joint as directed for more than 2 to 3 days are warning signs indicating that you should see a caregiver for a follow-up visit as soon as possible. Initially, a hairline fracture (break in bone) may not be evident on X-rays. Persistent pain and swelling indicate that further evaluation, non-weight bearing or use of the joint (use of crutches or slings as instructed), or further X-rays are indicated. X-rays may sometimes not show a small fracture until a week or 10 days later. Make a follow-up appointment with your own caregiver or one to whom we have referred you. A radiologist (specialist in reading X-rays) may read your X-rays. Make sure you know how you are to obtain your X-ray results. Do not assume everything is normal if you do not hear from us.  SEEK MEDICAL CARE IF:  Bruising, swelling, or pain increases.  SEEK IMMEDIATE MEDICAL CARE IF:   · Your fingers or toes are numb or blue.  · The pain is not responding to medications and continues to stay the same or get worse.  · The pain in your joint becomes severe.  · You   develop a fever over 102° F (38.9° C).  · It becomes impossible to move or use the joint.  MAKE SURE YOU:   · Understand these instructions.  · Will watch your condition.  · Will get help right away if you are not doing well or get worse.  Document Released: 01/27/2005 Document Revised: 04/21/2011 Document Reviewed: 09/15/2007  ExitCare® Patient Information ©2014 [employer].

## 2013-06-23 NOTE — ED Provider Notes (Signed)
CSN: [id number]     Arrival date & time 06/23/13  1128 History   First MD Initiated Contact with Patient 06/23/13 1219     Chief Complaint  Patient presents with  . Leg Pain    Patient says he has been having leg pain for about six months due to a "pulled muscle".  Patient says the pain has gotten worse so he came here to be evaluated.   Patient is a 63 year old male who presents with left knee and thigh pain. Patient reports that symptoms have been intermittent over the last 6 months, but current pain it has been constant for the last several days. Pain is severe in severity and sharp in quality. Patient reports over the last few months he has had them in falls is made pain worse. He states that walking or bearing weight on his left lower extremity he makes pain worse. He denies bowel or bladder incontinence, weakness, numbness, redness, or swelling. Patient was seen approximately 5 months ago for similar pain and was diagnosed with a pulled muscle. Pain was so severe today that he had to stop his dialysis early.  (Consider location/radiation/quality/duration/timing/severity/associated sxs/prior Treatment) Patient is a 63 y.o. male presenting with leg pain. The history is provided by the patient and medical records. No language interpreter was used.  Leg Pain   Past Medical History  Diagnosis Date  . Renal disorder   . Hypertension   . GERD (gastroesophageal reflux disease)   . Dialysis patient     Tu/Th/Sat  . Hyperkalemia    Past Surgical History  Procedure Laterality Date  . Av fistula placement      left arm   History reviewed. No pertinent family history. History  Substance Use Topics  . Smoking status: Current Some Day Smoker -- 0.25 packs/day for 50 years    Types: Cigarettes  . Smokeless tobacco: Never Used  . Alcohol Use: No    Review of Systems  All other systems reviewed and are negative.     Allergies  Review of patient's allergies indicates no known  allergies.  Home Medications   Prior to Admission medications   Medication Sig Start Date End Date Taking? Authorizing Provider  acetaminophen (TYLENOL) 500 MG tablet Take 1,000 mg by mouth every 4 (four) hours as needed (pain).    Historical Provider, MD  amLODipine (NORVASC) 10 MG tablet Take 10 mg by mouth 2 (two) times daily.     Historical Provider, MD  calcium acetate (PHOSLO) 667 MG capsule Take 2,001 mg by mouth 3 (three) times daily with meals.    Historical Provider, MD  doxazosin (CARDURA) 8 MG tablet Take 8 mg by mouth at bedtime.    Historical Provider, MD  lisinopril (PRINIVIL,ZESTRIL) 40 MG tablet Take 40 mg by mouth 2 (two) times daily.    Historical Provider, MD  NAPROXEN PO Take 1 tablet by mouth every 4 (four) hours as needed (pain).    Historical Provider, MD  OVER THE COUNTER MEDICATION Apply 1 application topically every 2 (two) hours as needed (muscle cramps). "Mioflex" cream    Historical Provider, MD   BP 165/88  Pulse 73  Temp(Src) 98.5 F (36.9 C) (Oral)  Resp 18  Ht 5\' 8"  (1.727 m)  Wt 175 lb (79.379 kg)  BMI 26.61 kg/m2  SpO2 100% Physical Exam  Nursing note and vitals reviewed. Constitutional: He is oriented to person, place, and time. He appears well-developed and well-nourished. No distress.  HENT:  Head: Normocephalic and  atraumatic.  Right Ear: External ear normal.  Left Ear: External ear normal.  Mouth/Throat: Oropharynx is clear and moist.  Eyes: Conjunctivae are normal. Pupils are equal, round, and reactive to light.  Neck: Normal range of motion. Neck supple.  Cardiovascular: Normal rate, regular rhythm and normal heart sounds.   Pulmonary/Chest: Effort normal and breath sounds normal.  Abdominal: Soft. Bowel sounds are normal. He exhibits no distension. There is no tenderness.  Musculoskeletal: He exhibits tenderness (TTP over left patella and distal, lateral portion of thigh.  Pain with AROM when flexing knee.  No eryrthema or edema  present.).  LEFT AV FISTULA with palpable thrill and no edema noted.    Neurological: He is alert and oriented to person, place, and time. He has normal reflexes.  Skin: Skin is warm and dry.  Psychiatric: He has a normal mood and affect.    ED Course  Procedures (including critical care time) Labs Review Labs Reviewed  BASIC METABOLIC PANEL - Abnormal; Notable for the following:    Chloride 95 (*)    Glucose, Bld 104 (*)    BUN 42 (*)    Creatinine, Ser 9.31 (*)    GFR calc non Af Amer 5 (*)    GFR calc Af Amer 6 (*)    All other components within normal limits  PHOSPHORUS - Abnormal; Notable for the following:    Phosphorus 6.2 (*)    All other components within normal limits    Imaging Review Dg Femur Left  06/23/2013   CLINICAL DATA:  Distal lateral femur pain after several falls 6 months ago  EXAM: LEFT FEMUR - 2 VIEW  COMPARISON:  None.  FINDINGS: There is femoral vascular calcification. There is no fracture or dislocation involving the femur. There is no significant soft tissue abnormality.  IMPRESSION: No acute findings   Electronically Signed   By: Esperanza Heiraymond  Rubner M.D.   On: 06/23/2013 14:22   Dg Knee Complete 4 Views Left  06/23/2013   CLINICAL DATA:  Femur knee tibia fibula pain for 6 months after false, femoral pain laterally, with knee pain anterior and just superior to the patella  EXAM: LEFT KNEE - COMPLETE 4+ VIEW  COMPARISON:  None.  FINDINGS: No fracture dislocation or joint effusion. Mild patellofemoral arthritis.  IMPRESSION: Mild degenerative change.  No acute findings.   Electronically Signed   By: Esperanza Heiraymond  Rubner M.D.   On: 06/23/2013 14:21     EKG Interpretation None      MDM   Final diagnoses:  Left leg pain  Left knee pain    Patient presents to the ED with complaints of left leg pain.  AF and VS were unremarkable in the ED.  PE as above and remarkable for tenderness to palpation over left patella and left inferior portion of tibia. X-rays  obtained to rule out occult fracture or other bony abnormality.  Lytes also obtained to rule out disturbances leading to muscle cramping.  Labs unremarkable and XRs showed no acute abnormality.  Suspect soft tissue pathology such as ligament/tendon pathology.  Offered knee immobilizer and crutches but patient refused.  Will treat with short course of pain medications (patient stated OTC medications were not helping) and have patient follow up with PCP for discussion regarding additional imaging such as MRI.     Johnney Ouerek Labib Cwynar, MD 06/23/13 [phone]

## 2013-06-30 NOTE — ED Provider Notes (Signed)
I saw and evaluated the patient, reviewed the resident's note and I agree with the findings and plan.   EKG Interpretation None      Is evaluated face-to-face. Concerning exam for acute neurological loss. Normal studies in the emergency room. He is appropriate for outpatient studies. I agree with Dr. Darla LeschesKunz'z evaluation, treatment, and plan as above.  Rolland PorterMark Kaelum, MD 06/30/13 0030

## 2013-07-07 ENCOUNTER — Encounter (HOSPITAL_COMMUNITY): Payer: Self-pay | Admitting: Emergency Medicine

## 2013-07-07 ENCOUNTER — Emergency Department (INDEPENDENT_AMBULATORY_CARE_PROVIDER_SITE_OTHER)
Admission: EM | Admit: 2013-07-07 | Discharge: 2013-07-07 | Disposition: A | Discharge status: Home / Self Care | Payer: Medicare Other | Source: Home / Self Care | Attending: Emergency Medicine | Admitting: Emergency Medicine

## 2013-07-07 DIAGNOSIS — M79606 Pain in leg, unspecified: Principal | ICD-10-CM

## 2013-07-07 DIAGNOSIS — M79609 Pain in unspecified limb: Secondary | ICD-10-CM

## 2013-07-07 DIAGNOSIS — G8929 Other chronic pain: Secondary | ICD-10-CM

## 2013-07-07 MED ORDER — OXYCODONE-ACETAMINOPHEN 5-325 MG PO TABS: 1.0000 | ORAL_TABLET | Freq: Four times a day (QID) | ORAL | Status: DC | PRN

## 2013-07-07 MED ORDER — CYCLOBENZAPRINE HCL 10 MG PO TABS: 10.0000 mg | ORAL_TABLET | Freq: Three times a day (TID) | ORAL | Status: DC | PRN

## 2013-07-07 NOTE — ED Provider Notes (Signed)
Medical screening examination/treatment/procedure(s) were performed by non-physician practitioner and as supervising physician I was immediately available for consultation/collaboration.  Leslee Home, M.D.  Reuben Likes, MD 07/07/13 [phone]

## 2013-07-07 NOTE — ED Notes (Signed)
Pt  Reports  l  Leg  Pain    X  6  Months      intermittant       Worse  Recently      Pt  Reports  Pain is worse  On  Weight bearing      Pt  Seen  sev  Weeks  Ago  In er  And  Is  Out of  His  Pain meds  Unclear  If pt  Has  A  pcp

## 2013-07-07 NOTE — ED Provider Notes (Signed)
CSN: [id number]     Arrival date & time 07/07/13  [id number] History   First MD Initiated Contact with Patient 07/07/13 1003     Chief Complaint  Patient presents with  . Leg Pain   (Consider location/radiation/quality/duration/timing/severity/associated sxs/prior Treatment) HPI Comments: 63 year old male, ESRD, on renal dialysis presents complaining of severe chronic left leg pain. He has had pain in the left leg for about 6 months. He says he was seen here for this back in January and was prescribed and Flexeril. He was told to make her primary care appointment. He was seen again for this problem a few weeks ago in the emergency department, treated the same, and again told he needs a primary care physician. He says that he go out paperwork for her primary care physician 3 days ago. He is here because the pain is severe. It is no different than it was previously. It is in the lateral portion of the left thigh and the left knee. No swelling or numbness in the leg. This resulted from a twisting injury about 6 months ago, he thinks he pulled a muscle and it never got better.  Patient is a 63 y.o. male presenting with leg pain.  Leg Pain   Past Medical History  Diagnosis Date  . Renal disorder   . Hypertension   . GERD (gastroesophageal reflux disease)   . Dialysis patient     Tu/Th/Sat  . Hyperkalemia    Past Surgical History  Procedure Laterality Date  . Av fistula placement      left arm   History reviewed. No pertinent family history. History  Substance Use Topics  . Smoking status: Current Some Day Smoker -- 0.25 packs/day for 50 years    Types: Cigarettes  . Smokeless tobacco: Never Used  . Alcohol Use: No    Review of Systems  Musculoskeletal: Positive for myalgias (left leg pain ).  All other systems reviewed and are negative.   Allergies  Review of patient's allergies indicates no known allergies.  Home Medications   Prior to Admission medications   Medication Sig  Start Date End Date Taking? Authorizing Provider  acetaminophen (TYLENOL) 500 MG tablet Take 1,000 mg by mouth every 4 (four) hours as needed (pain).    Historical Provider, MD  amLODipine (NORVASC) 10 MG tablet Take 10 mg by mouth 2 (two) times daily.     Historical Provider, MD  calcium acetate (PHOSLO) 667 MG capsule Take 2,001 mg by mouth 3 (three) times daily with meals.    Historical Provider, MD  doxazosin (CARDURA) 8 MG tablet Take 8 mg by mouth at bedtime.    Historical Provider, MD  famotidine (PEPCID) 40 MG tablet Take 40 mg by mouth daily.    Historical Provider, MD  labetalol (NORMODYNE) 200 MG tablet Take 600 mg by mouth 2 (two) times daily.    Historical Provider, MD  lisinopril (PRINIVIL,ZESTRIL) 40 MG tablet Take 40 mg by mouth 2 (two) times daily.    Historical Provider, MD  OVER THE COUNTER MEDICATION Apply 1 application topically every 2 (two) hours as needed (muscle cramps). "Mioflex" cream    Historical Provider, MD  oxyCODONE-acetaminophen (PERCOCET/ROXICET) 5-325 MG per tablet Take 1 tablet by mouth every 4 (four) hours as needed for severe pain. 06/23/13   Johnney Ouerek Kunz, MD   There were no vitals taken for this visit. Physical Exam  Nursing note and vitals reviewed. Constitutional: He is oriented to person, place, and time. He appears well-developed and  well-nourished. No distress.  HENT:  Head: Normocephalic.  Cardiovascular: Normal rate, regular rhythm, normal heart sounds and intact distal pulses.   No peripheral edema   Pulmonary/Chest: Effort normal and breath sounds normal. No respiratory distress.  Musculoskeletal:       Left hip: Normal.       Left knee: Normal.       Left upper leg: He exhibits tenderness (TTP lateral thigh at IT band ).  Neurological: He is alert and oriented to person, place, and time. Coordination normal.  Skin: Skin is warm and dry. No rash noted. He is not diaphoretic.  Psychiatric: He has a normal mood and affect. Judgment normal.     ED Course  Procedures (including critical care time) Labs Review Labs Reviewed - No data to display  Imaging Review No results found.   MDM   1. Chronic leg pain    Referred to orthopedics  Meds ordered this encounter  Medications  . oxyCODONE-acetaminophen (PERCOCET/ROXICET) 5-325 MG per tablet    Sig: Take 1 tablet by mouth every 6 (six) hours as needed for severe pain.    Dispense:  20 tablet    Refill:  0    Order Specific Question:  Supervising Provider    Answer:  Linna Hoff [phone]  . cyclobenzaprine (FLEXERIL) 10 MG tablet    Sig: Take 1 tablet (10 mg total) by mouth 3 (three) times daily as needed for muscle spasms.    Dispense:  60 tablet    Refill:  0    Order Specific Question:  Supervising Provider    Answer:  Bradd Canary D [[id number]]       Graylon Good, PA-C 07/07/13 1120

## 2013-07-07 NOTE — Discharge Instructions (Signed)
Musculoskeletal Pain °Musculoskeletal pain is muscle and boney aches and pains. These pains can occur in any part of the body. Your caregiver may treat you without knowing the cause of the pain. They may treat you if blood or urine tests, X-rays, and other tests were normal.  °CAUSES °There is often not a definite cause or reason for these pains. These pains may be caused by a type of germ (virus). The discomfort may also come from overuse. Overuse includes working out too hard when your body is not fit. Boney aches also come from weather changes. Bone is sensitive to atmospheric pressure changes. °HOME CARE INSTRUCTIONS  °· Ask when your test results will be ready. Make sure you get your test results. °· Only take over-the-counter or prescription medicines for pain, discomfort, or fever as directed by your caregiver. If you were given medications for your condition, do not drive, operate machinery or power tools, or sign legal documents for 24 hours. Do not drink alcohol. Do not take sleeping pills or other medications that may interfere with treatment. °· Continue all activities unless the activities cause more pain. When the pain lessens, slowly resume normal activities. Gradually increase the intensity and duration of the activities or exercise. °· During periods of severe pain, bed rest may be helpful. Lay or sit in any position that is comfortable. °· Putting ice on the injured area. °· Put ice in a bag. °· Place a towel between your skin and the bag. °· Leave the ice on for 15 to 20 minutes, 3 to 4 times a day. °· Follow up with your caregiver for continued problems and no reason can be found for the pain. If the pain becomes worse or does not go away, it may be necessary to repeat tests or do additional testing. Your caregiver may need to look further for a possible cause. °SEEK IMMEDIATE MEDICAL CARE IF: °· You have pain that is getting worse and is not relieved by medications. °· You develop chest pain  that is associated with shortness or breath, sweating, feeling sick to your stomach (nauseous), or throw up (vomit). °· Your pain becomes localized to the abdomen. °· You develop any new symptoms that seem different or that concern you. °MAKE SURE YOU:  °· Understand these instructions. °· Will watch your condition. °· Will get help right away if you are not doing well or get worse. °Document Released: 01/27/2005 Document Revised: 04/21/2011 Document Reviewed: 10/01/2012 °Exit[employer]® Patient Information ©2014 [employer]. ° °

## 2013-08-19 ENCOUNTER — Encounter: Payer: Self-pay | Admitting: Family Medicine

## 2013-08-19 ENCOUNTER — Ambulatory Visit (INDEPENDENT_AMBULATORY_CARE_PROVIDER_SITE_OTHER): Payer: Medicare Other | Admitting: Family Medicine

## 2013-08-19 VITALS — BP 161/65 | HR 68 | Temp 98.2°F | Ht 69.0 in | Wt 164.0 lb

## 2013-08-19 DIAGNOSIS — G245 Blepharospasm: Secondary | ICD-10-CM

## 2013-08-19 DIAGNOSIS — R7309 Other abnormal glucose: Secondary | ICD-10-CM

## 2013-08-19 DIAGNOSIS — I10 Essential (primary) hypertension: Secondary | ICD-10-CM

## 2013-08-19 DIAGNOSIS — Z1211 Encounter for screening for malignant neoplasm of colon: Secondary | ICD-10-CM

## 2013-08-19 DIAGNOSIS — R739 Hyperglycemia, unspecified: Secondary | ICD-10-CM

## 2013-08-19 DIAGNOSIS — F17200 Nicotine dependence, unspecified, uncomplicated: Secondary | ICD-10-CM

## 2013-08-19 LAB — POCT GLYCOSYLATED HEMOGLOBIN (HGB A1C): Hemoglobin A1C: 5.4

## 2013-08-19 MED ORDER — CYCLOBENZAPRINE HCL 10 MG PO TABS: 10.0000 mg | ORAL_TABLET | Freq: Three times a day (TID) | ORAL | Status: DC | PRN

## 2013-08-19 MED ORDER — CYCLOBENZAPRINE HCL 10 MG PO TABS: 10.0000 mg | ORAL_TABLET | Freq: Three times a day (TID) | ORAL | Status: AC | PRN

## 2013-08-19 NOTE — Assessment & Plan Note (Signed)
Mild, nondisabling. ?Coule be due to withdrawal from muscle relaxant. Refilled flexeril and will monitor.

## 2013-08-19 NOTE — Assessment & Plan Note (Signed)
Over goal today on high dose norvasc, lisinopril, labetalol, and cardura. Suspect volume overload with JVD. Pt reports good compliance. Goal 135/80. Nephrology managing BP medications.

## 2013-08-19 NOTE — Progress Notes (Signed)
Patient ID: Corey OmsJames Bracamonte, male   DOB: 12/28/1950, 63 y.o.   MRN: [id number]   Subjective:  HPI:   Corey Lamb is a 63 y.o. male with a history of ESRD on HD and HTN here to establish care.  [place] lives in [place] and hasn't seen PCP in [place] in a long time. He was told he needed more reliable follow up so he is establishing care more near to home. His only concern is what he believes is a muscle strain. He reports pulling a muscle in his left leg in the early Winter which has gotten somewhat better but not resolved. Mod-severe, worsened with activity, not always present, non-radiating, better with flexeril and heat, not helped by percocet.   Review of Systems:  He denies HA, visual changes, hearing changes, dizziness, cough, sore throat, SOB, CP, abdominal pain, N/V/D/C, dysuria, urinary frequency and urgency, arthralgias, myalgias, and rash.   + intermittent L eye twitching for past several weeks, erectile dysfunction. All other systems reviewed and are negative.    Past Medical History: He has ESRD due to hypertension and started HD 10 years ago. EMR reports history of noncompliance.  He has an eye doctor in [place]. He has been told that he has hypertensive retinopathy.  Had colonoscopy in [place], [place] about 10 years ago. "Everything was good." 10 year follow up was recommended.  NKDA Medications: reviewed and updated  FH:  He reports having a brother who died of colon CA.  2 sisters have diabetes. HTN runs in the family on both sides.   SH: Denies EtOH. Smokes 6-7 cigarettes per day, but smoked more than this since he was [age]yrs old. No illicit drugs. Lives with girlfriend with whom he is occasionally sexually active. Uses condoms. He lives [address] with his girlfriend. 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  Objective:  Physical Exam: BP 161/65  Pulse 68  Temp(Src) 98.2 F (36.8 C) (Oral)  Ht 5\' 9"  (1.753 m)  Wt 164 lb (74.39 kg)  BMI 24.21  kg/m2  Gen: Pleasant 63 y.o. male in NAD HEENT: MMM, EOMI, PERRL, anicteric sclerae, fair dentition CV: RRR, no MRG, JVP ~5cm above clavicle, AVF LUE with palpable thrill Resp: Non-labored, Clear without wheezes or crackles.  Abd: Soft, NTND, BS present, no guarding or organomegaly MSK: No edema noted. L thigh with minimal tenderness to palpation and palpable muscle spasm. Decreased AROM with knee flexion.  Neuro: Alert and oriented, speech normal, intermittent L-eye blepharospasm  Assessment:     Corey OmsJames Petrella is a 63 y.o. male here to establish care.     Plan:     See problem list for problem-specific plans.  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  Tobacco use disorder: smoking cessation HCM: Colonoscopy HTN: BP medications per nephrology Muscle strain: Flexeril while continues with exercises.  ED: Recommended lifestyle modifications - will consider therapy at reduced dose (viagra 25mg )

## 2013-08-19 NOTE — Patient Instructions (Signed)
Thank you for coming in today! It was great to meet you!  We are checking some labs today, and I will call you if they are abnormal. If you do not hear from me by phone or letter in 2 weeks, please call us as I may have been unable to reach you.   - Continue taking your medications.  - Call to schedule a colonoscopy. - Continue trying to cut down on the number of cigarettes you smoke.  As you leave, make an appointment to follow up with me in 3 months.   Take care and seek immediate care sooner if you develop any concerns.  Please feel free to call with any questions or concerns at any time, at [phone]. - Dr. Jarvis NewcomerGrunz

## 2013-08-19 NOTE — Assessment & Plan Note (Signed)
Currently cutting down, now to about 1/3 ppd (43 pack-year history). Pre-contemplative. Brief smoking cessation counseling provided but he is not amenable to dedicated office visit.

## 2013-08-20 LAB — LIPID PANEL
Cholesterol: 122 mg/dL (ref 0–200)
HDL: 36 mg/dL — AB (ref >39)
LDL CALC: 66 mg/dL (ref 0–99)
TRIGLYCERIDES: 101 mg/dL (ref <150)
Total CHOL/HDL Ratio: 3.4 Ratio
VLDL: 20 mg/dL (ref 0–40)

## 2013-09-27 ENCOUNTER — Encounter: Payer: Self-pay | Admitting: Family Medicine

## 2014-07-01 IMAGING — CR DG KNEE COMPLETE 4+V*L*
4 series · 4 of 4 positions shown · non-contrast
Comparison: None.

CLINICAL DATA: Femur knee tibia fibula pain for 6 months after
false, femoral pain laterally, with knee pain anterior and just
superior to the patella

EXAM:
LEFT KNEE - COMPLETE 4+ VIEW

[t knee lat left]
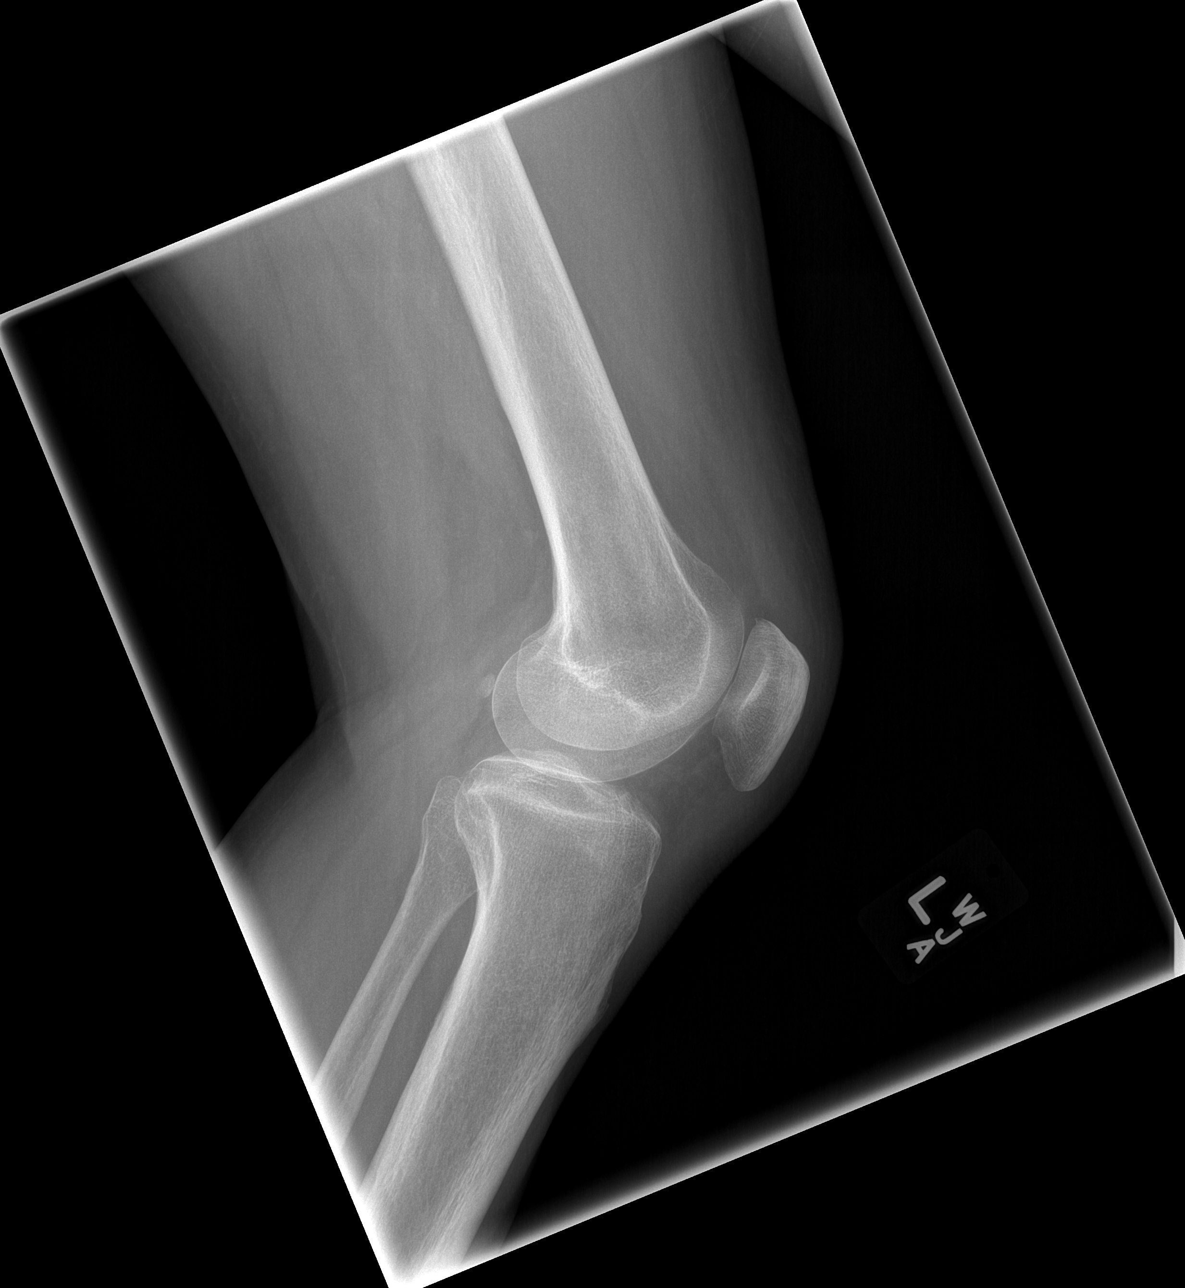

[t knee ap left]
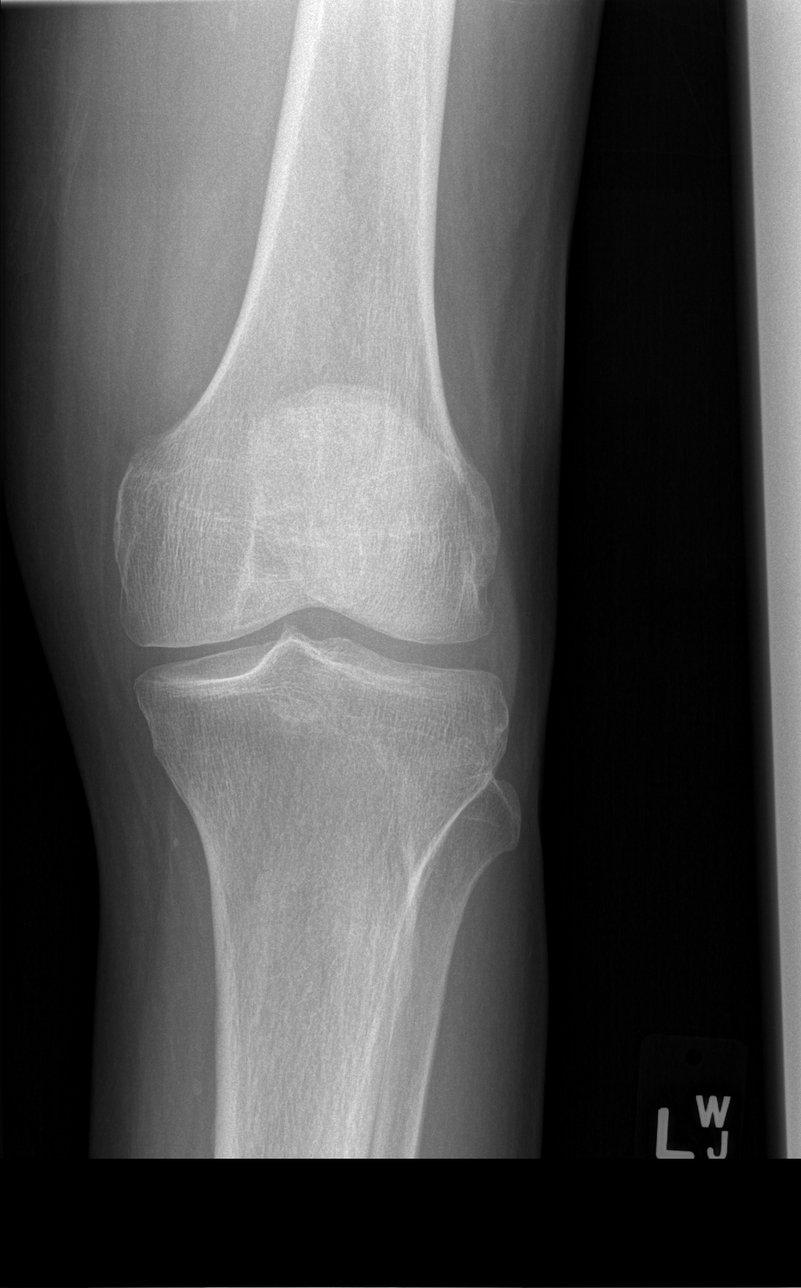

[t knee oblique left (1 of 2)]
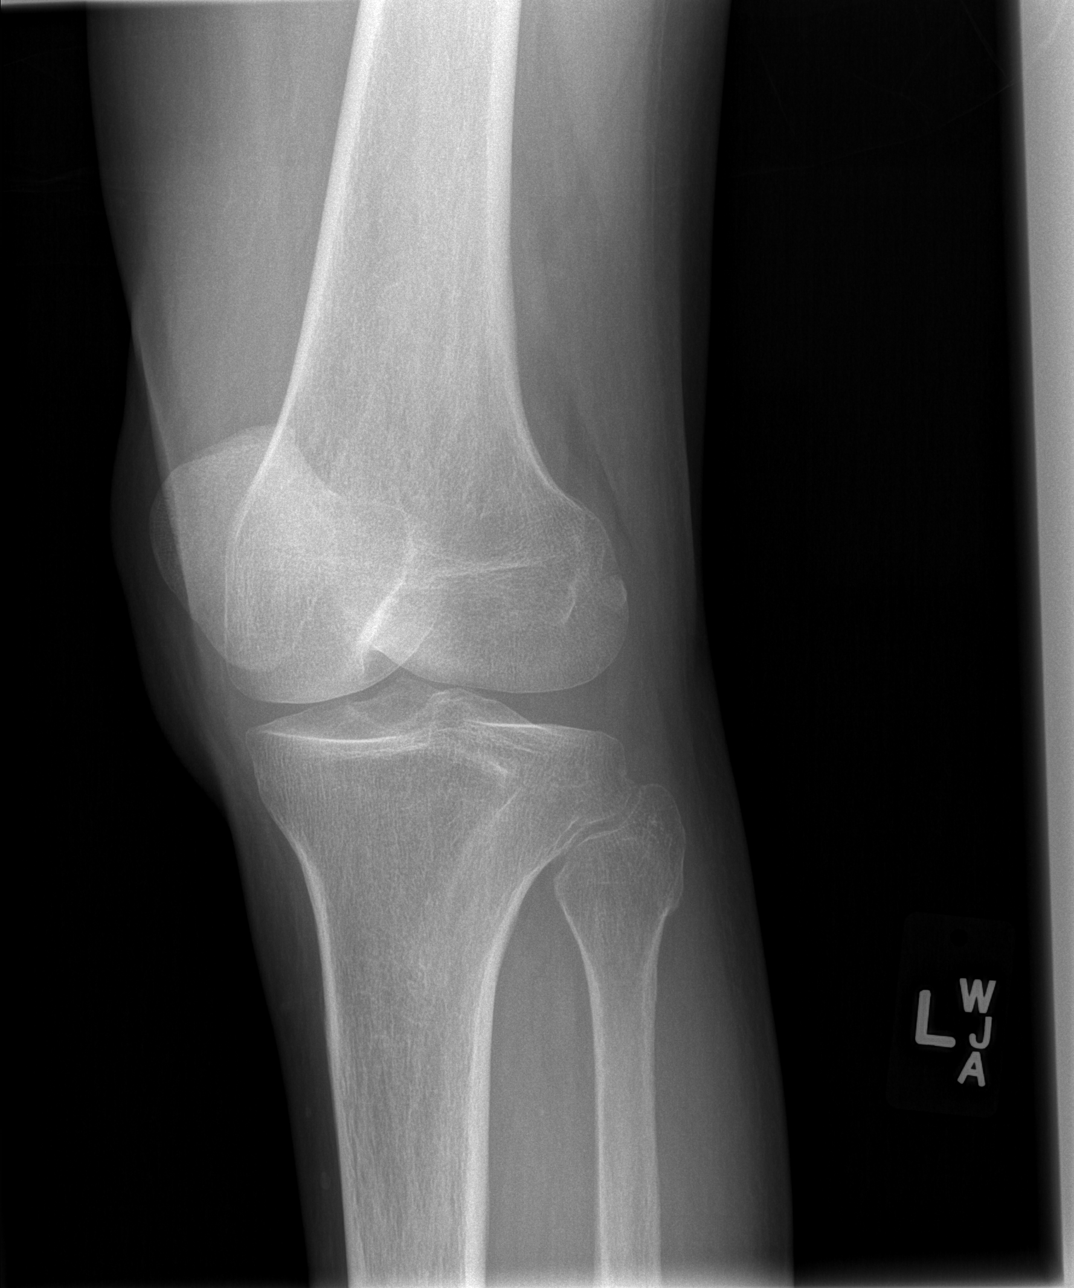

[t knee oblique left (2 of 2)]
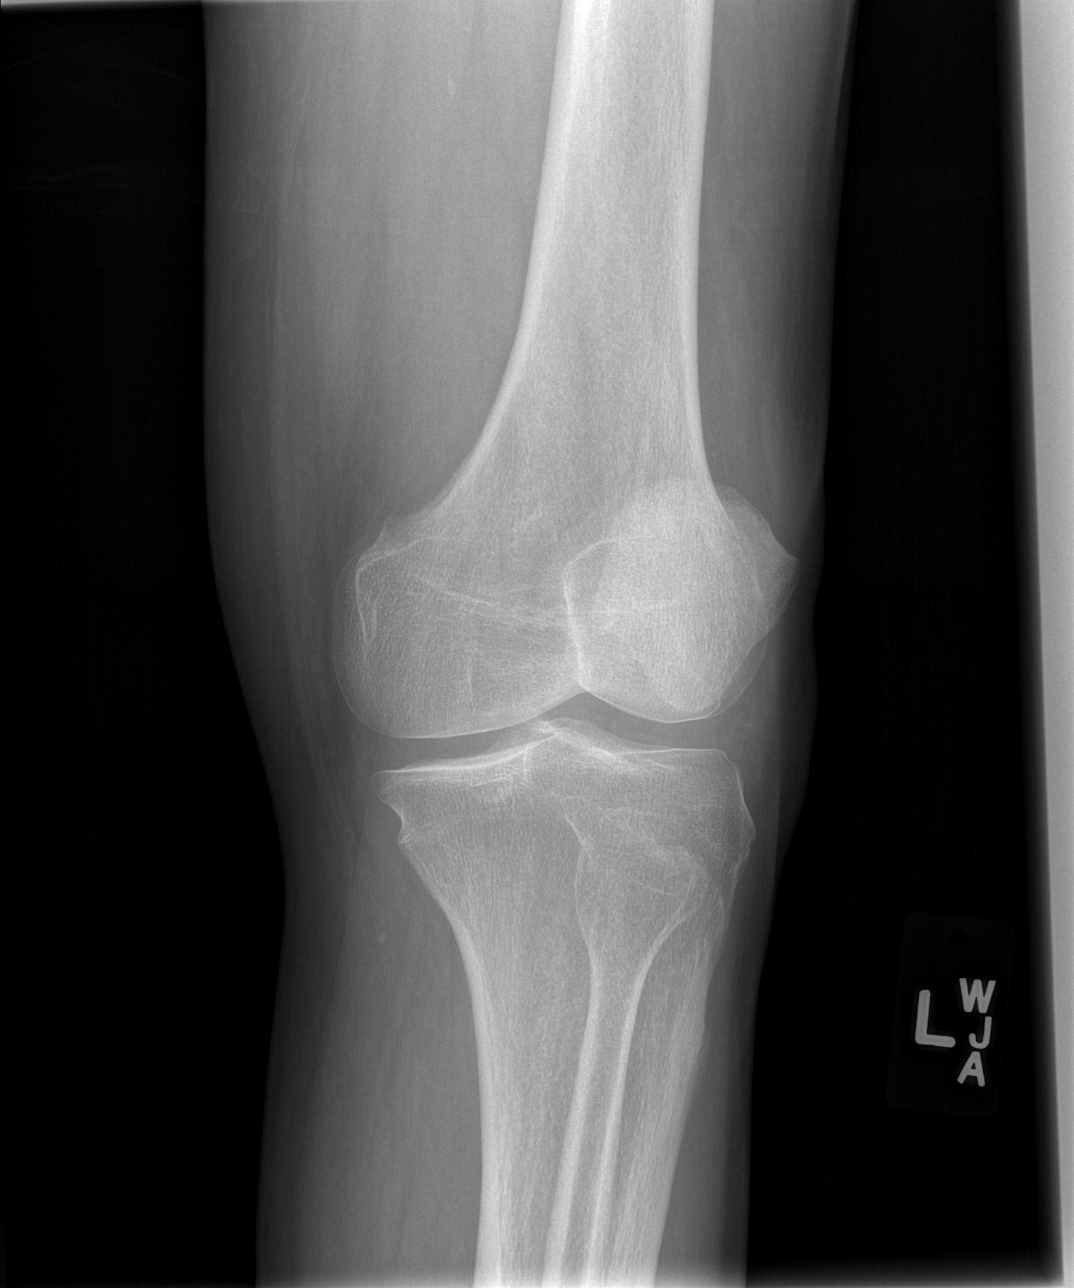

[4 of 4 positions shown; findings below may reference images not displayed]

FINDINGS: No fracture dislocation or joint effusion. Mild patellofemoral
arthritis.
IMPRESSION: Mild degenerative change.  No acute findings.

## 2018-01-10 DEATH — deceased
